# Patient Record
Sex: Female | Born: 1935
Health system: Southern US, Community
[De-identification: ages and names within clinical notes are randomized; demographics above are authoritative.]

## PROBLEM LIST (undated history)

## (undated) DIAGNOSIS — M1990 Unspecified osteoarthritis, unspecified site: Secondary | ICD-10-CM

## (undated) DIAGNOSIS — J302 Other seasonal allergic rhinitis: Secondary | ICD-10-CM

## (undated) DIAGNOSIS — M1711 Unilateral primary osteoarthritis, right knee: Secondary | ICD-10-CM

## (undated) DIAGNOSIS — B1910 Unspecified viral hepatitis B without hepatic coma: Secondary | ICD-10-CM

## (undated) DIAGNOSIS — R112 Nausea with vomiting, unspecified: Secondary | ICD-10-CM

## (undated) DIAGNOSIS — Z9889 Other specified postprocedural states: Secondary | ICD-10-CM

## (undated) DIAGNOSIS — I10 Essential (primary) hypertension: Secondary | ICD-10-CM

## (undated) HISTORY — PX: APPENDECTOMY: SHX54

## (undated) HISTORY — PX: CATARACT EXTRACTION, BILATERAL: SHX1313

---

## 1963-03-28 DIAGNOSIS — B1910 Unspecified viral hepatitis B without hepatic coma: Secondary | ICD-10-CM

## 1963-03-28 HISTORY — DX: Unspecified viral hepatitis B without hepatic coma: B19.10

## 1991-01-25 HISTORY — PX: ABDOMINAL HYSTERECTOMY: SHX81

## 2004-07-22 ENCOUNTER — Ambulatory Visit: Payer: Self-pay | Admitting: Internal Medicine

## 2004-08-07 ENCOUNTER — Ambulatory Visit (HOSPITAL_COMMUNITY): Admission: RE | Admit: 2004-08-07 | Discharge: 2004-08-07 | Payer: Self-pay | Admitting: Internal Medicine

## 2004-08-28 ENCOUNTER — Ambulatory Visit: Payer: Self-pay | Admitting: Internal Medicine

## 2004-09-24 ENCOUNTER — Ambulatory Visit: Payer: Self-pay | Admitting: Internal Medicine

## 2004-10-28 ENCOUNTER — Ambulatory Visit: Payer: Self-pay | Admitting: Internal Medicine

## 2004-11-13 ENCOUNTER — Ambulatory Visit: Payer: Self-pay | Admitting: Internal Medicine

## 2005-05-05 ENCOUNTER — Ambulatory Visit: Payer: Self-pay | Admitting: Internal Medicine

## 2005-08-18 ENCOUNTER — Ambulatory Visit (HOSPITAL_COMMUNITY): Admission: RE | Admit: 2005-08-18 | Discharge: 2005-08-18 | Payer: Self-pay | Admitting: Internal Medicine

## 2005-11-04 ENCOUNTER — Ambulatory Visit: Payer: Self-pay | Admitting: Internal Medicine

## 2005-11-06 ENCOUNTER — Ambulatory Visit: Payer: Self-pay | Admitting: Family Medicine

## 2005-11-10 ENCOUNTER — Ambulatory Visit (HOSPITAL_COMMUNITY): Admission: RE | Admit: 2005-11-10 | Discharge: 2005-11-10 | Payer: Self-pay | Admitting: Family Medicine

## 2006-03-12 ENCOUNTER — Ambulatory Visit: Payer: Self-pay | Admitting: Internal Medicine

## 2006-08-04 ENCOUNTER — Ambulatory Visit: Payer: Self-pay | Admitting: Internal Medicine

## 2006-08-24 ENCOUNTER — Ambulatory Visit (HOSPITAL_COMMUNITY): Admission: RE | Admit: 2006-08-24 | Discharge: 2006-08-24 | Payer: Self-pay | Admitting: Internal Medicine

## 2006-09-17 ENCOUNTER — Ambulatory Visit: Payer: Self-pay | Admitting: Internal Medicine

## 2007-04-29 DIAGNOSIS — I10 Essential (primary) hypertension: Secondary | ICD-10-CM | POA: Insufficient documentation

## 2007-04-29 DIAGNOSIS — Z9849 Cataract extraction status, unspecified eye: Secondary | ICD-10-CM | POA: Insufficient documentation

## 2007-04-29 DIAGNOSIS — E785 Hyperlipidemia, unspecified: Secondary | ICD-10-CM | POA: Insufficient documentation

## 2007-06-27 ENCOUNTER — Telehealth (INDEPENDENT_AMBULATORY_CARE_PROVIDER_SITE_OTHER): Payer: Self-pay | Admitting: Nurse Practitioner

## 2007-07-11 ENCOUNTER — Telehealth (INDEPENDENT_AMBULATORY_CARE_PROVIDER_SITE_OTHER): Payer: Self-pay | Admitting: *Deleted

## 2007-07-26 ENCOUNTER — Ambulatory Visit: Payer: Self-pay | Admitting: Family Medicine

## 2007-07-26 ENCOUNTER — Encounter (INDEPENDENT_AMBULATORY_CARE_PROVIDER_SITE_OTHER): Payer: Self-pay | Admitting: Nurse Practitioner

## 2007-07-26 DIAGNOSIS — N3000 Acute cystitis without hematuria: Secondary | ICD-10-CM | POA: Insufficient documentation

## 2007-07-26 DIAGNOSIS — E876 Hypokalemia: Secondary | ICD-10-CM | POA: Insufficient documentation

## 2007-07-26 LAB — CONVERTED CEMR LAB
Bilirubin Urine: NEGATIVE
Cholesterol, target level: 200 mg/dL
Glucose, Urine, Semiquant: NEGATIVE
HDL goal, serum: 40 mg/dL
LDL Goal: 130 mg/dL
WBC Urine, dipstick: NEGATIVE
pH: 6

## 2007-07-27 ENCOUNTER — Encounter (INDEPENDENT_AMBULATORY_CARE_PROVIDER_SITE_OTHER): Payer: Self-pay | Admitting: Nurse Practitioner

## 2007-07-27 DIAGNOSIS — R809 Proteinuria, unspecified: Secondary | ICD-10-CM | POA: Insufficient documentation

## 2007-07-27 LAB — CONVERTED CEMR LAB
ALT: 14 units/L (ref 0–35)
Albumin: 4.4 g/dL (ref 3.5–5.2)
Alkaline Phosphatase: 92 units/L (ref 39–117)
Basophils Absolute: 0 10*3/uL (ref 0.0–0.1)
Eosinophils Absolute: 0.1 10*3/uL (ref 0.0–0.7)
Eosinophils Relative: 1 % (ref 0–5)
Glucose, Bld: 96 mg/dL (ref 70–99)
LDL Cholesterol: 67 mg/dL (ref 0–99)
Lymphocytes Relative: 50 % — ABNORMAL HIGH (ref 12–46)
Lymphs Abs: 2.3 10*3/uL (ref 0.7–4.0)
MCV: 95.5 fL (ref 78.0–100.0)
Microalb, Ur: 7.76 mg/dL — ABNORMAL HIGH (ref 0.00–1.89)
Neutrophils Relative %: 42 % — ABNORMAL LOW (ref 43–77)
Platelets: 283 10*3/uL (ref 150–400)
Potassium: 3.2 meq/L — ABNORMAL LOW (ref 3.5–5.3)
RDW: 13.2 % (ref 11.5–15.5)
Sodium: 143 meq/L (ref 135–145)
TSH: 1.326 microintl units/mL (ref 0.350–5.50)
Total Bilirubin: 0.7 mg/dL (ref 0.3–1.2)
Total Protein: 7.6 g/dL (ref 6.0–8.3)
Triglycerides: 74 mg/dL (ref <150)
VLDL: 15 mg/dL (ref 0–40)
WBC: 4.5 10*3/uL (ref 4.0–10.5)

## 2007-08-30 ENCOUNTER — Encounter (INDEPENDENT_AMBULATORY_CARE_PROVIDER_SITE_OTHER): Payer: Self-pay | Admitting: Internal Medicine

## 2007-08-30 ENCOUNTER — Ambulatory Visit (HOSPITAL_COMMUNITY): Admission: RE | Admit: 2007-08-30 | Discharge: 2007-08-30 | Payer: Self-pay | Admitting: Family Medicine

## 2007-09-10 ENCOUNTER — Encounter (INDEPENDENT_AMBULATORY_CARE_PROVIDER_SITE_OTHER): Payer: Self-pay | Admitting: Internal Medicine

## 2008-02-03 ENCOUNTER — Ambulatory Visit: Payer: Self-pay | Admitting: Nurse Practitioner

## 2008-02-03 DIAGNOSIS — G44209 Tension-type headache, unspecified, not intractable: Secondary | ICD-10-CM

## 2008-02-03 DIAGNOSIS — R252 Cramp and spasm: Secondary | ICD-10-CM

## 2008-02-03 HISTORY — DX: Cramp and spasm: R25.2

## 2008-02-03 HISTORY — DX: Tension-type headache, unspecified, not intractable: G44.209

## 2008-02-07 ENCOUNTER — Ambulatory Visit (HOSPITAL_COMMUNITY): Admission: RE | Admit: 2008-02-07 | Discharge: 2008-02-07 | Payer: Self-pay | Admitting: Internal Medicine

## 2008-02-08 ENCOUNTER — Ambulatory Visit: Payer: Self-pay | Admitting: Nurse Practitioner

## 2008-02-08 LAB — CONVERTED CEMR LAB
Albumin: 4.3 g/dL (ref 3.5–5.2)
Alkaline Phosphatase: 68 units/L (ref 39–117)
BUN: 18 mg/dL (ref 6–23)
CO2: 28 meq/L (ref 19–32)
Calcium: 9.7 mg/dL (ref 8.4–10.5)
Chloride: 103 meq/L (ref 96–112)
Eosinophils Absolute: 0.1 10*3/uL (ref 0.0–0.7)
Glucose, Bld: 89 mg/dL (ref 70–99)
LDL Cholesterol: 61 mg/dL (ref 0–99)
Lymphocytes Relative: 54 % — ABNORMAL HIGH (ref 12–46)
Lymphs Abs: 2.5 10*3/uL (ref 0.7–4.0)
Monocytes Relative: 9 % (ref 3–12)
Neutro Abs: 1.7 10*3/uL (ref 1.7–7.7)
Neutrophils Relative %: 35 % — ABNORMAL LOW (ref 43–77)
Platelets: 258 10*3/uL (ref 150–400)
Potassium: 3.3 meq/L — ABNORMAL LOW (ref 3.5–5.3)
RBC: 4.13 M/uL (ref 3.87–5.11)
Sodium: 142 meq/L (ref 135–145)
Total Protein: 7 g/dL (ref 6.0–8.3)
Triglycerides: 92 mg/dL (ref <150)
WBC: 4.7 10*3/uL (ref 4.0–10.5)

## 2008-02-09 ENCOUNTER — Encounter (INDEPENDENT_AMBULATORY_CARE_PROVIDER_SITE_OTHER): Payer: Self-pay | Admitting: Nurse Practitioner

## 2008-09-17 ENCOUNTER — Emergency Department (HOSPITAL_COMMUNITY): Admission: EM | Admit: 2008-09-17 | Discharge: 2008-09-18 | Payer: Self-pay | Admitting: Emergency Medicine

## 2008-11-08 ENCOUNTER — Telehealth (INDEPENDENT_AMBULATORY_CARE_PROVIDER_SITE_OTHER): Payer: Self-pay | Admitting: Nurse Practitioner

## 2008-11-09 ENCOUNTER — Ambulatory Visit: Payer: Self-pay | Admitting: Nurse Practitioner

## 2008-11-09 LAB — CONVERTED CEMR LAB
Albumin: 4.5 g/dL (ref 3.5–5.2)
BUN: 14 mg/dL (ref 6–23)
Calcium: 9.8 mg/dL (ref 8.4–10.5)
Chloride: 103 meq/L (ref 96–112)
Cholesterol: 113 mg/dL (ref 0–200)
Creatinine, Ser: 0.94 mg/dL (ref 0.40–1.20)
Eosinophils Absolute: 0.1 10*3/uL (ref 0.0–0.7)
Eosinophils Relative: 2 % (ref 0–5)
Glucose, Bld: 106 mg/dL — ABNORMAL HIGH (ref 70–99)
HCT: 41.3 % (ref 36.0–46.0)
HDL: 43 mg/dL (ref >39)
Hemoglobin: 13.9 g/dL (ref 12.0–15.0)
Lymphocytes Relative: 51 % — ABNORMAL HIGH (ref 12–46)
Lymphs Abs: 2.7 10*3/uL (ref 0.7–4.0)
MCV: 97.2 fL (ref 78.0–100.0)
Monocytes Absolute: 0.5 10*3/uL (ref 0.1–1.0)
Potassium: 3.7 meq/L (ref 3.5–5.3)
RDW: 14.1 % (ref 11.5–15.5)
Triglycerides: 69 mg/dL (ref <150)

## 2008-11-12 ENCOUNTER — Encounter (INDEPENDENT_AMBULATORY_CARE_PROVIDER_SITE_OTHER): Payer: Self-pay | Admitting: Nurse Practitioner

## 2008-11-29 ENCOUNTER — Ambulatory Visit: Payer: Self-pay | Admitting: Nurse Practitioner

## 2008-11-29 DIAGNOSIS — F341 Dysthymic disorder: Secondary | ICD-10-CM

## 2008-11-29 DIAGNOSIS — Z780 Asymptomatic menopausal state: Secondary | ICD-10-CM | POA: Insufficient documentation

## 2008-11-29 HISTORY — DX: Dysthymic disorder: F34.1

## 2008-12-07 ENCOUNTER — Ambulatory Visit (HOSPITAL_COMMUNITY): Admission: RE | Admit: 2008-12-07 | Discharge: 2008-12-07 | Payer: Self-pay | Admitting: Internal Medicine

## 2008-12-11 ENCOUNTER — Encounter (INDEPENDENT_AMBULATORY_CARE_PROVIDER_SITE_OTHER): Payer: Self-pay | Admitting: Nurse Practitioner

## 2009-01-02 ENCOUNTER — Encounter (INDEPENDENT_AMBULATORY_CARE_PROVIDER_SITE_OTHER): Payer: Self-pay | Admitting: Nurse Practitioner

## 2009-01-11 ENCOUNTER — Ambulatory Visit: Payer: Self-pay | Admitting: Nurse Practitioner

## 2009-11-06 ENCOUNTER — Ambulatory Visit: Payer: Self-pay | Admitting: Nurse Practitioner

## 2009-11-06 DIAGNOSIS — IMO0002 Reserved for concepts with insufficient information to code with codable children: Secondary | ICD-10-CM

## 2009-11-06 DIAGNOSIS — M1710 Unilateral primary osteoarthritis, unspecified knee: Secondary | ICD-10-CM | POA: Insufficient documentation

## 2009-11-25 ENCOUNTER — Ambulatory Visit: Payer: Self-pay | Admitting: Nurse Practitioner

## 2009-11-25 ENCOUNTER — Ambulatory Visit (HOSPITAL_COMMUNITY): Admission: RE | Admit: 2009-11-25 | Discharge: 2009-11-25 | Payer: Self-pay | Admitting: Nurse Practitioner

## 2009-11-26 LAB — CONVERTED CEMR LAB
Basophils Relative: 0 % (ref 0–1)
CO2: 31 meq/L (ref 19–32)
Cholesterol: 109 mg/dL (ref 0–200)
Eosinophils Absolute: 0 10*3/uL (ref 0.0–0.7)
Eosinophils Relative: 1 % (ref 0–5)
Glucose, Bld: 88 mg/dL (ref 70–99)
HCT: 39.3 % (ref 36.0–46.0)
Hemoglobin: 13.2 g/dL (ref 12.0–15.0)
MCHC: 33.6 g/dL (ref 30.0–36.0)
MCV: 97 fL (ref 78.0–100.0)
Monocytes Absolute: 0.3 10*3/uL (ref 0.1–1.0)
Monocytes Relative: 7 % (ref 3–12)
Neutro Abs: 1.8 10*3/uL (ref 1.7–7.7)
RBC: 4.05 M/uL (ref 3.87–5.11)
Sodium: 143 meq/L (ref 135–145)
Total Bilirubin: 0.6 mg/dL (ref 0.3–1.2)
Total Protein: 7 g/dL (ref 6.0–8.3)
Triglycerides: 92 mg/dL (ref <150)
VLDL: 18 mg/dL (ref 0–40)

## 2009-12-09 ENCOUNTER — Ambulatory Visit: Payer: Self-pay | Admitting: Nurse Practitioner

## 2009-12-16 ENCOUNTER — Ambulatory Visit (HOSPITAL_COMMUNITY): Admission: RE | Admit: 2009-12-16 | Discharge: 2009-12-16 | Payer: Self-pay | Admitting: Internal Medicine

## 2010-06-02 ENCOUNTER — Telehealth (INDEPENDENT_AMBULATORY_CARE_PROVIDER_SITE_OTHER): Payer: Self-pay | Admitting: Nurse Practitioner

## 2010-07-02 ENCOUNTER — Telehealth (INDEPENDENT_AMBULATORY_CARE_PROVIDER_SITE_OTHER): Payer: Self-pay | Admitting: Nurse Practitioner

## 2010-07-27 HISTORY — PX: REPLACEMENT TOTAL KNEE: SUR1224

## 2010-08-16 ENCOUNTER — Encounter: Payer: Self-pay | Admitting: Occupational Therapy

## 2010-08-27 NOTE — Progress Notes (Signed)
Summary: Query:  Refill Amiloride?  Phone Note Outgoing Call   Summary of Call: Do you want to refill amiloride?  Last seen 5/11. Initial call taken by: Dutch Quint RN,  June 02, 2010 5:23 PM  Follow-up for Phone Call        yes, ok to refill Follow-up by: Lehman Prom FNP,  June 02, 2010 5:38 PM  Additional Follow-up for Phone Call Additional follow up Details #1::        Noted.  Refill completed. Dutch Quint RN  June 02, 2010 5:48 PM

## 2010-08-27 NOTE — Progress Notes (Signed)
Summary: Query:  Refill Klor-Con?  Phone Note Outgoing Call   Summary of Call: Refill Klor-Con?  Last seen 11/2009. Initial call taken by: Dutch Quint RN,  July 02, 2010 5:44 PM  Follow-up for Phone Call        refill done Follow-up by: Lehman Prom FNP,  July 02, 2010 5:55 PM    Prescriptions: POTASSIUM CHLORIDE CR 10 MEQ CR-TABS (POTASSIUM CHLORIDE) Two tablets by mouth daily  **note change in dose**  #60 x 5   Entered and Authorized by:   Lehman Prom FNP   Signed by:   Lehman Prom FNP on 07/02/2010   Method used:   Electronically to        [facility] [address] (retail)       [address]       [place], [place]  [id number]       Ph: [phone]       Fax: [phone]   RxID:   [id number]

## 2010-08-27 NOTE — Assessment & Plan Note (Signed)
Summary: F/u Left X-ray results   Vital Signs:  Patient profile:   76 year old female Menstrual status:  hysterectomy Weight:      151.4 pounds BMI:     29.19 BSA:     1.67 Temp:     97.6 degrees F oral Pulse rate:   66 / minute Pulse rhythm:   regular Resp:     16 per minute BP sitting:   107 / 66  (left arm) Cuff size:   regular  Vitals Entered By: Levon Hedger (Dec 09, 2009 8:54 AM) CC: left knee pain since the last week in March pt states , Hypertension Management, Lipid Management Is Patient Diabetic? No Pain Assessment Patient in pain? yes     Location: knee Intensity: 5 Onset of pain  With activity  Does patient need assistance? Functional Status Self care Ambulation Normal     Menstrual Status hysterectomy Last PAP Result hysterectomy in 1992   CC:  left knee pain since the last week in March pt states , Hypertension Management, and Lipid Management.  History of Present Illness:  Pt into the office for f/u on left knee pain. X-ray done on 11/25/2009  Some days knee pain is ok but on some days the knee is "on fire" Uses an assistive device - cane Tries to wear a knee suport but states that that makes it worse Irritation to the left knee when sleeping at night - medially No recent falls due to the pain  Hypertension History:      She denies headache, chest pain, and palpitations.        Positive major cardiovascular risk factors include female age 50 years old or older, hyperlipidemia, and hypertension.  Negative major cardiovascular risk factors include no history of diabetes and non-tobacco-user status.        Further assessment for target organ damage reveals no history of ASHD, cardiac end-organ damage (CHF/LVH), stroke/TIA, peripheral vascular disease, renal insufficiency, or hypertensive retinopathy.    Lipid Management History:      Positive NCEP/ATP III risk factors include female age 42 years old or older and hypertension.  Negative NCEP/ATP III  risk factors include non-diabetic, non-tobacco-user status, no ASHD (atherosclerotic heart disease), no prior stroke/TIA, no peripheral vascular disease, and no history of aortic aneurysm.        The patient states that she knows about the "Therapeutic Lifestyle Change" diet.  Her compliance with the TLC diet is good.  The patient expresses understanding of adjunctive measures for cholesterol lowering.  She expresses no side effects from her lipid-lowering medication.  The patient denies any symptoms to suggest myopathy or liver disease.      Allergies (verified): No Known Drug Allergies  Review of Systems MS:  Complains of joint pain; left knee.  Physical Exam  General:  alert.   Head:  normocephalic.   Ears:  ear piercing(s) noted.   Lungs:  normal breath sounds.   Heart:  normal rate and regular rhythm.   Msk:  up to the exam table with one assist Neurologic:  alert & oriented X3.   cane use - limping gait. limited flexion of knee with ambulation   Knee Exam  General:    average weight.    Knee Exam:    Left:    Inspection:  Normal    Palpation:  Abnormal       Location:  medial capsule    Stability:  stable    Tenderness:  medial capsule  Swelling:  medial capsule    Erythema:  no   Impression & Recommendations:  Problem # 1:  ARTHRITIS, LEFT KNEE (ICD-716.96) x-rays reviewed with pt will start prednisone taper will also give neurontin ? tendinitis as pt has sensitivity medially   Problem # 2:  HYPERTENSION (ICD-401.9) stable continue current meds Her updated medication list for this problem includes:    Norvasc 10 Mg Tabs (Amlodipine besylate) .Marland Kitchen... 1 by mouth every morning for blood pressure    Amiloride-hydrochlorothiazide 5-50 Mg Tabs (Amiloride-hydrochlorothiazide) .Marland Kitchen... 1 tablet by mouth daily for blood pressure  Problem # 3:  HYPOKALEMIA (ICD-276.8) noted during last lab visit pt aware - need new Rx sent to pharmacy  Problem # 4:  DYSLIPIDEMIA  (ICD-272.4) checked during last visit.  will check Her updated medication list for this problem includes:    Lovastatin 40 Mg Tabs (Lovastatin) .Marland Kitchen... 1 by mouth at bedtime for cholesterol  Complete Medication List: 1)  Norvasc 10 Mg Tabs (Amlodipine besylate) .Marland Kitchen.. 1 by mouth every morning for blood pressure 2)  Nevanac 0.1 % Susp (Nepafenac) 3)  Diazepam 5 Mg Tabs (Diazepam) .... 1/2 to 1 at bedtime for rest 4)  Lovastatin 40 Mg Tabs (Lovastatin) .Marland Kitchen.. 1 by mouth at bedtime for cholesterol 5)  Amiloride-hydrochlorothiazide 5-50 Mg Tabs (Amiloride-hydrochlorothiazide) .Marland Kitchen.. 1 tablet by mouth daily for blood pressure 6)  Calcium 600/vitamin D 600-400 Mg-unit Chew (Calcium carbonate-vitamin d) .Marland Kitchen.. 1 tablet by mouth two times a day 7)  Loratadine 10 Mg Tabs (Loratadine) .Marland Kitchen.. 1 tablet by mouth daily as needed for allergies 8)  Potassium Chloride Cr 10 Meq Cr-tabs (Potassium chloride) .... Two tablets by mouth daily  **note change in dose** 9)  Celexa 20 Mg Tabs (Citalopram hydrobromide) .... One tablet by mouth daily for nerves 10)  Ra Fish Oil 1000 Mg Caps (Omega-3 fatty acids) .... Once daily 11)  Ibuprofen 800 Mg Tabs (Ibuprofen) .... One tablet by mouth three times a day as needed for pain 12)  Prednisone 10 Mg Tabs (Prednisone) .... 30mg  x 2 days, 20mg  x 2 days then 10mg  x 2 days for inflammation 13)  Gabapentin 300 Mg Caps (Gabapentin) .... One tablet by mouth nightly for 3 nights then as needed  Hypertension Assessment/Plan:      The patient's hypertensive risk group is category B: At least one risk factor (excluding diabetes) with no target organ damage.  Her calculated 10 year risk of coronary heart disease is 6 %.  Today's blood pressure is 107/66.  Her blood pressure goal is < 140/90.  Lipid Assessment/Plan:      Based on NCEP/ATP III, the patient's risk factor category is "2 or more risk factors and a calculated 10 year CAD risk of < 20%".  The patient's lipid goals are as follows:  Total cholesterol goal is 200; LDL cholesterol goal is 130; HDL cholesterol goal is 40; Triglyceride goal is 150.    Patient Instructions: 1)  Knee - x-ray shows mild arthritis 2)  ? some tendinitis in your knee (this would not show on x-ray) 3)  Start prednisone 4)  30mg  (3 tabs) Monday 5)  30mg  (3 tabs) Tuesday 6)  20mg  (2 tabs) Wednesday 7)  20mg  (2 tabs) Thursday 8)  10mg  (1 tab) Friday 9)  10mg  (1 tab) Saturday 10)  Take gabapentin 300mg  by mouth nightly for at least 3 nights.  This may make you sleepy. Then take as needed 11)  Follow up as needed for knee pain Prescriptions: GABAPENTIN 300  MG CAPS (GABAPENTIN) One tablet by mouth nightly for 3 nights then as needed  #30 x 0   Entered and Authorized by:   Lehman Prom FNP   Signed by:   Lehman Prom FNP on 12/09/2009   Method used:   Print then Give to Patient   RxID:   [id number] PREDNISONE 10 MG TABS (PREDNISONE) 30mg  x 2 days, 20mg  x 2 days then 10mg  x 2 days for inflammation  #12 x 0   Entered and Authorized by:   Lehman Prom FNP   Signed by:   Lehman Prom FNP on 12/09/2009   Method used:   Print then Give to Patient   RxID:   [id number] POTASSIUM CHLORIDE CR 10 MEQ CR-TABS (POTASSIUM CHLORIDE) Two tablets by mouth daily  **note change in dose**  #60 x 5   Entered and Authorized by:   Lehman Prom FNP   Signed by:   Lehman Prom FNP on 12/09/2009   Method used:   Print then Give to Patient   RxID:   [id number]    X-ray  Procedure date:  11/25/2009  Findings:      left knee - No acute fracture or subluxation. Diffuse osteopenia.  Mild degenerative changes

## 2010-08-27 NOTE — Assessment & Plan Note (Signed)
Summary: Left knee pain   Vital Signs:  Patient profile:   76 year old female Height:      60.50 inches Weight:      156.3 pounds Temp:     97.7 degrees F Pulse rate:   62 / minute Pulse rhythm:   regular Resp:     14 per minute BP sitting:   126 / 76  (right arm) Cuff size:   regular  Vitals Entered By: Chauncy Passy, SMA CC: Pt. is here complaining of left knee pain. This has been going on for about 3 weeks. She states her knee got swollen has a burning sensation that radiates behind her left thigh. She is concnered as well about some numbness she feels on her hands. , Depression, Hypertension Management, Lipid Management Is Patient Diabetic? No Pain Assessment Patient in pain? no       Does patient need assistance? Functional Status Self care Ambulation Normal   CC:  Pt. is here complaining of left knee pain. This has been going on for about 3 weeks. She states her knee got swollen has a burning sensation that radiates behind her left thigh. She is concnered as well about some numbness she feels on her hands. , Depression, Hypertension Management, and Lipid Management.  History of Present Illness:  Pt into the office with complaints of left knee pain Previous knee pain about 3 years ago that self resolved Current pain started March 26th and has continued until current time +swelling Tylenol did not resolve the pain, bayer aspirin did give some improvement Denies any trauma She has applied heat to the affected area limping gait  Depression History:      The patient is having a depressed mood most of the day and has a diminished interest in her usual daily activities.  The patient denies insomnia, fatigue (loss of energy), and recurrent thoughts of death or suicide.        The patient denies that she feels like life is not worth living, denies that she wishes that she were dead, and denies that she has thought about ending her life.         Depression Treatment  History:  Prior Medication Used:   Start Date: Assessment of Effect:   Comments:  Celexa (citalopram)     11/29/2008   much improvement     --  Hypertension History:      She denies headache, chest pain, and palpitations.  pt is taking meds as ordered.        Positive major cardiovascular risk factors include female age 29 years old or older, hyperlipidemia, and hypertension.  Negative major cardiovascular risk factors include no history of diabetes and non-tobacco-user status.        Further assessment for target organ damage reveals no history of ASHD, cardiac end-organ damage (CHF/LVH), stroke/TIA, peripheral vascular disease, renal insufficiency, or hypertensive retinopathy.    Lipid Management History:      Positive NCEP/ATP III risk factors include female age 40 years old or older and hypertension.  Negative NCEP/ATP III risk factors include non-diabetic, non-tobacco-user status, no ASHD (atherosclerotic heart disease), no prior stroke/TIA, no peripheral vascular disease, and no history of aortic aneurysm.        The patient states that she knows about the "Therapeutic Lifestyle Change" diet.  Her compliance with the TLC diet is fair.  She expresses no side effects from her lipid-lowering medication.  Comments include: pt is taking meds as ordered.  The patient denies any symptoms to suggest myopathy or liver disease.       Habits & Providers  Alcohol-Tobacco-Diet     Alcohol drinks/day: 0     Tobacco Status: never  Exercise-Depression-Behavior     Does Patient Exercise: no     Depression Counseling: further diagnostic testing and/or other treatment is indicated     Drug Use: never     Seat Belt Use: 100     Sun Exposure: occasionally  Current Medications (verified): 1)  Norvasc 10 Mg  Tabs (Amlodipine Besylate) .Marland Kitchen.. 1 By Mouth Every Morning For Blood Pressure 2)  Nevanac 0.1 %  Susp (Nepafenac) 3)  Diazepam 5 Mg  Tabs (Diazepam) .... 1/2 To 1 At Bedtime For Rest 4)   Lovastatin 40 Mg  Tabs (Lovastatin) .Marland Kitchen.. 1 By Mouth At Bedtime For Cholesterol 5)  Amiloride-Hydrochlorothiazide 5-50 Mg  Tabs (Amiloride-Hydrochlorothiazide) .Marland Kitchen.. 1 Tablet By Mouth Daily For Blood Pressure 6)  Calcium 600/vitamin D 600-400 Mg-Unit  Chew (Calcium Carbonate-Vitamin D) .Marland Kitchen.. 1 Tablet By Mouth Two Times A Day 7)  Loratadine 10 Mg  Tabs (Loratadine) .Marland Kitchen.. 1 Tablet By Mouth Daily As Needed For Allergies 8)  Potassium Chloride Cr 10 Meq Cr-Tabs (Potassium Chloride) .... One Tablet By Mouth Daily 9)  Celexa 20 Mg Tabs (Citalopram Hydrobromide) .... One Tablet By Mouth Daily For Nerves 10)  Ra Fish Oil 1000 Mg Caps (Omega-3 Fatty Acids) .... Once Daily  Allergies (verified): No Known Drug Allergies  Review of Systems General:  Denies fever. CV:  Denies chest pain or discomfort. Resp:  Denies cough. GI:  Denies abdominal pain. MS:  Complains of joint pain; left knee stiffness and swelling noticed some pain in lower back but only after problems with knee started.  Physical Exam  General:  alert.   Head:  normocephalic.   Lungs:  normal breath sounds.   Heart:  normal rate and regular rhythm.   Msk:  up to the exam table Neurologic:  alert & oriented X3.     Knee Exam  General:    average weight.    Knee Exam:    Left:    Inspection:  Abnormal    Palpation:  Abnormal       Location:  medial capsule    Stability:  stable    Tenderness:  medial collateral    Swelling:  medial collateral    Erythema:  no    arthritic changes    Impression & Recommendations:  Problem # 1:  KNEE PAIN, LEFT (ICD-719.46)  advised pt to use ace wrap ibuprofen 800mg  by mouth three times a day x 3 days then as needed  will need to x-ray if pain continues or worsens Her updated medication list for this problem includes:    Ibuprofen 800 Mg Tabs (Ibuprofen) ..... One tablet by mouth three times a day as needed for pain  Problem # 2:  HYPERTENSION (ICD-401.9) BP is stable DASH  diet Her updated medication list for this problem includes:    Norvasc 10 Mg Tabs (Amlodipine besylate) .Marland Kitchen... 1 by mouth every morning for blood pressure    Amiloride-hydrochlorothiazide 5-50 Mg Tabs (Amiloride-hydrochlorothiazide) .Marland Kitchen... 1 tablet by mouth daily for blood pressure  Problem # 3:  UNSPECIFIED BREAST SCREENING (ICD-V76.10) will schedule after May 14th Orders: Mammogram (Screening) (Mammo)  Complete Medication List: 1)  Norvasc 10 Mg Tabs (Amlodipine besylate) .Marland Kitchen.. 1 by mouth every morning for blood pressure 2)  Nevanac 0.1 % Susp (Nepafenac) 3)  Diazepam  5 Mg Tabs (Diazepam) .... 1/2 to 1 at bedtime for rest 4)  Lovastatin 40 Mg Tabs (Lovastatin) .Marland Kitchen.. 1 by mouth at bedtime for cholesterol 5)  Amiloride-hydrochlorothiazide 5-50 Mg Tabs (Amiloride-hydrochlorothiazide) .Marland Kitchen.. 1 tablet by mouth daily for blood pressure 6)  Calcium 600/vitamin D 600-400 Mg-unit Chew (Calcium carbonate-vitamin d) .Marland Kitchen.. 1 tablet by mouth two times a day 7)  Loratadine 10 Mg Tabs (Loratadine) .Marland Kitchen.. 1 tablet by mouth daily as needed for allergies 8)  Potassium Chloride Cr 10 Meq Cr-tabs (Potassium chloride) .... One tablet by mouth daily 9)  Celexa 20 Mg Tabs (Citalopram hydrobromide) .... One tablet by mouth daily for nerves 10)  Ra Fish Oil 1000 Mg Caps (Omega-3 fatty acids) .... Once daily 11)  Ibuprofen 800 Mg Tabs (Ibuprofen) .... One tablet by mouth three times a day as needed for pain  Hypertension Assessment/Plan:      The patient's hypertensive risk group is category B: At least one risk factor (excluding diabetes) with no target organ damage.  Her calculated 10 year risk of coronary heart disease is 9 %.  Today's blood pressure is 126/76.  Her blood pressure goal is < 140/90.  Lipid Assessment/Plan:      Based on NCEP/ATP III, the patient's risk factor category is "2 or more risk factors and a calculated 10 year CAD risk of < 20%".  The patient's lipid goals are as follows: Total cholesterol goal  is 200; LDL cholesterol goal is 130; HDL cholesterol goal is 40; Triglyceride goal is 150.    Patient Instructions: 1)  Schedule fasting lab visit in 2 weeks - cbc, cmp, thyroid, lipids 2)  Do not eat after midnight before this visit 3)  Left knee - take iburprofen 800mg  by mouth with breakfast, lunch and dinner for Wednesday, Thursday and Friday. 4)  Then take as needed. 5)  Use Ace Wrap when up during the day 6)  If knee pain continues you will need to follow up with this provider Prescriptions: IBUPROFEN 800 MG TABS (IBUPROFEN) One tablet by mouth three times a day as needed for pain  #50 x 0   Entered and Authorized by:   Lehman Prom FNP   Signed by:   Lehman Prom FNP on 11/06/2009   Method used:   Print then Give to Patient   RxID:   [id number] DIAZEPAM 5 MG  TABS (DIAZEPAM) 1/2 to 1 at bedtime for rest  #30 x 0   Entered and Authorized by:   Lehman Prom FNP   Signed by:   Lehman Prom FNP on 11/06/2009   Method used:   Print then Give to Patient   RxID:   [id number]

## 2010-09-09 ENCOUNTER — Other Ambulatory Visit: Payer: Self-pay | Admitting: General Practice

## 2010-09-09 ENCOUNTER — Other Ambulatory Visit (HOSPITAL_COMMUNITY): Payer: Self-pay | Admitting: Cardiology

## 2010-09-09 ENCOUNTER — Ambulatory Visit
Admission: RE | Admit: 2010-09-09 | Discharge: 2010-09-09 | Disposition: A | Discharge status: Home / Self Care | Payer: Medicaid Other | Source: Ambulatory Visit | Attending: General Practice | Admitting: General Practice

## 2010-09-09 DIAGNOSIS — Z01811 Encounter for preprocedural respiratory examination: Secondary | ICD-10-CM

## 2010-09-25 ENCOUNTER — Telehealth (INDEPENDENT_AMBULATORY_CARE_PROVIDER_SITE_OTHER): Payer: Self-pay | Admitting: Nurse Practitioner

## 2010-10-02 NOTE — Progress Notes (Signed)
Summary: Query:  Refill amiloride-HCTZ?  Phone Note Outgoing Call   Refills Requested: Medication #1:  AMILORIDE-HYDROCHLOROTHIAZIDE 5-50 MG  TABS 1 tablet by mouth daily for blood pressure Summary of Call: Last seen 11/2009.  Refill amiloride-HCTZ per protocol? Initial call taken by: Dutch Quint RN,  September 25, 2010 10:10 AM  Follow-up for Phone Call        ok to refill. call pt and advise her to make a f/u appt Follow-up by: Lehman Prom FNP,  September 25, 2010 10:41 AM  Additional Follow-up for Phone Call Additional follow up Details #1::        Refill completed -- pt. notified.  Appt. scheduled 10/09/10.  Dutch Quint RN  September 25, 2010 12:52 PM

## 2010-10-03 ENCOUNTER — Ambulatory Visit (HOSPITAL_COMMUNITY)
Admission: RE | Admit: 2010-10-03 | Discharge: 2010-10-03 | Disposition: A | Discharge status: Home / Self Care | Payer: Medicare Other | Source: Ambulatory Visit | Attending: Cardiology | Admitting: Cardiology

## 2010-10-03 ENCOUNTER — Ambulatory Visit (HOSPITAL_COMMUNITY): Admission: RE | Admit: 2010-10-03 | Payer: Medicaid Other | Source: Ambulatory Visit

## 2010-10-03 DIAGNOSIS — R079 Chest pain, unspecified: Secondary | ICD-10-CM | POA: Insufficient documentation

## 2010-10-03 DIAGNOSIS — Z01810 Encounter for preprocedural cardiovascular examination: Secondary | ICD-10-CM | POA: Insufficient documentation

## 2010-10-03 DIAGNOSIS — R002 Palpitations: Secondary | ICD-10-CM | POA: Insufficient documentation

## 2010-10-03 DIAGNOSIS — R9439 Abnormal result of other cardiovascular function study: Secondary | ICD-10-CM | POA: Insufficient documentation

## 2010-10-03 DIAGNOSIS — I4949 Other premature depolarization: Secondary | ICD-10-CM | POA: Insufficient documentation

## 2010-10-03 DIAGNOSIS — I10 Essential (primary) hypertension: Secondary | ICD-10-CM | POA: Insufficient documentation

## 2010-10-03 DIAGNOSIS — I491 Atrial premature depolarization: Secondary | ICD-10-CM | POA: Insufficient documentation

## 2010-10-03 DIAGNOSIS — E7800 Pure hypercholesterolemia, unspecified: Secondary | ICD-10-CM | POA: Insufficient documentation

## 2010-10-03 LAB — LIPID PANEL: VLDL: 33 mg/dL (ref 0–40)

## 2010-10-03 LAB — HEPATIC FUNCTION PANEL
AST: 30 U/L (ref 0–37)
Albumin: 4 g/dL (ref 3.5–5.2)
Alkaline Phosphatase: 68 U/L (ref 39–117)
Total Bilirubin: 0.8 mg/dL (ref 0.3–1.2)

## 2010-10-03 LAB — BASIC METABOLIC PANEL
BUN: 8 mg/dL (ref 6–23)
CO2: 29 mEq/L (ref 19–32)
Calcium: 9.6 mg/dL (ref 8.4–10.5)
Creatinine, Ser: 0.87 mg/dL (ref 0.4–1.2)
GFR calc Af Amer: >60 mL/min (ref >60)
Glucose, Bld: 108 mg/dL — ABNORMAL HIGH (ref 70–99)

## 2010-10-03 MED ORDER — TECHNETIUM TC 99M TETROFOSMIN IV KIT
10.0000 | PACK | Freq: Once | INTRAVENOUS | Status: AC | PRN
  Administered 2010-10-03: 10 via INTRAVENOUS

## 2010-10-03 MED ORDER — TECHNETIUM TC 99M TETROFOSMIN IV KIT
30.0000 | PACK | Freq: Once | INTRAVENOUS | Status: AC | PRN
  Administered 2010-10-03: 30 via INTRAVENOUS

## 2010-11-11 LAB — URINE MICROSCOPIC-ADD ON

## 2010-11-11 LAB — URINE CULTURE: Colony Count: 40000

## 2010-11-11 LAB — URINALYSIS, ROUTINE W REFLEX MICROSCOPIC
Bilirubin Urine: NEGATIVE
Glucose, UA: NEGATIVE mg/dL
Ketones, ur: NEGATIVE mg/dL
Protein, ur: NEGATIVE mg/dL
Urobilinogen, UA: 1 mg/dL (ref 0.0–1.0)

## 2010-12-23 ENCOUNTER — Encounter (HOSPITAL_COMMUNITY)
Admission: RE | Admit: 2010-12-23 | Discharge: 2010-12-23 | Disposition: A | Discharge status: Home / Self Care | Payer: Medicare Other | Source: Ambulatory Visit | Attending: Orthopedic Surgery | Admitting: Orthopedic Surgery

## 2010-12-25 ENCOUNTER — Encounter (HOSPITAL_COMMUNITY)
Admission: RE | Admit: 2010-12-25 | Discharge: 2010-12-25 | Disposition: A | Discharge status: Home / Self Care | Payer: Medicare Other | Source: Ambulatory Visit | Attending: Orthopedic Surgery | Admitting: Orthopedic Surgery

## 2010-12-25 LAB — DIFFERENTIAL
Eosinophils Absolute: 0.1 10*3/uL (ref 0.0–0.7)
Eosinophils Relative: 1 % (ref 0–5)
Lymphs Abs: 2.6 10*3/uL (ref 0.7–4.0)
Monocytes Absolute: 0.4 10*3/uL (ref 0.1–1.0)
Monocytes Relative: 7 % (ref 3–12)

## 2010-12-25 LAB — URINALYSIS, ROUTINE W REFLEX MICROSCOPIC
Bilirubin Urine: NEGATIVE
Glucose, UA: NEGATIVE mg/dL
Ketones, ur: NEGATIVE mg/dL
Protein, ur: NEGATIVE mg/dL

## 2010-12-25 LAB — COMPREHENSIVE METABOLIC PANEL
Alkaline Phosphatase: 76 U/L (ref 39–117)
BUN: 11 mg/dL (ref 6–23)
CO2: 31 mEq/L (ref 19–32)
Chloride: 106 mEq/L (ref 96–112)
Creatinine, Ser: 0.86 mg/dL (ref 0.4–1.2)
GFR calc non Af Amer: >60 mL/min (ref >60)
Glucose, Bld: 101 mg/dL — ABNORMAL HIGH (ref 70–99)
Potassium: 3.6 mEq/L (ref 3.5–5.1)
Total Bilirubin: 0.7 mg/dL (ref 0.3–1.2)

## 2010-12-25 LAB — CBC
HCT: 38.4 % (ref 36.0–46.0)
MCH: 33.3 pg (ref 26.0–34.0)
MCHC: 35.2 g/dL (ref 30.0–36.0)
MCV: 94.6 fL (ref 78.0–100.0)
Platelets: 248 10*3/uL (ref 150–400)
RDW: 12.8 % (ref 11.5–15.5)
WBC: 5.6 10*3/uL (ref 4.0–10.5)

## 2010-12-25 LAB — ABO/RH: ABO/RH(D): AB POS

## 2010-12-25 LAB — SURGICAL PCR SCREEN: MRSA, PCR: NEGATIVE

## 2010-12-26 LAB — URINE CULTURE
Colony Count: 9000
Culture  Setup Time: 201205311949

## 2011-01-05 ENCOUNTER — Inpatient Hospital Stay (HOSPITAL_COMMUNITY)
Admission: RE | Admit: 2011-01-05 | Discharge: 2011-01-08 | DRG: 470 | Disposition: A | Discharge status: Skilled Nursing Facility | Payer: Medicare Other | Source: Ambulatory Visit | Attending: Orthopedic Surgery | Admitting: Orthopedic Surgery

## 2011-01-05 DIAGNOSIS — I10 Essential (primary) hypertension: Secondary | ICD-10-CM | POA: Diagnosis present

## 2011-01-05 DIAGNOSIS — Z79899 Other long term (current) drug therapy: Secondary | ICD-10-CM

## 2011-01-05 DIAGNOSIS — Z7901 Long term (current) use of anticoagulants: Secondary | ICD-10-CM

## 2011-01-05 DIAGNOSIS — Z01810 Encounter for preprocedural cardiovascular examination: Secondary | ICD-10-CM

## 2011-01-05 DIAGNOSIS — M1710 Unilateral primary osteoarthritis, unspecified knee: Principal | ICD-10-CM | POA: Diagnosis present

## 2011-01-05 DIAGNOSIS — I959 Hypotension, unspecified: Secondary | ICD-10-CM | POA: Diagnosis not present

## 2011-01-05 DIAGNOSIS — Z01812 Encounter for preprocedural laboratory examination: Secondary | ICD-10-CM

## 2011-01-05 DIAGNOSIS — Z91040 Latex allergy status: Secondary | ICD-10-CM

## 2011-01-05 DIAGNOSIS — D62 Acute posthemorrhagic anemia: Secondary | ICD-10-CM | POA: Diagnosis not present

## 2011-01-06 LAB — CBC
HCT: 29.2 % — ABNORMAL LOW (ref 36.0–46.0)
MCHC: 35.3 g/dL (ref 30.0–36.0)
MCV: 94.2 fL (ref 78.0–100.0)
Platelets: 216 10*3/uL (ref 150–400)
RDW: 13.2 % (ref 11.5–15.5)
WBC: 11.6 10*3/uL — ABNORMAL HIGH (ref 4.0–10.5)

## 2011-01-06 LAB — BASIC METABOLIC PANEL
BUN: 10 mg/dL (ref 6–23)
Creatinine, Ser: 0.77 mg/dL (ref 0.4–1.2)
GFR calc Af Amer: >60 mL/min (ref >60)
GFR calc non Af Amer: >60 mL/min (ref >60)

## 2011-01-06 LAB — GLUCOSE, CAPILLARY: Glucose-Capillary: 123 mg/dL — ABNORMAL HIGH (ref 70–99)

## 2011-01-06 NOTE — Op Note (Signed)
NAMEMarland Collins  AMAURIA, YOUNTS NO.:  [id number]  MEDICAL RECORD NO.:  [id number]  LOCATION:  5004                         FACILITY:  [facility]  PHYSICIAN:  Mila Homer. Sherlean Foot, M.D. DATE OF BIRTH:  08-Jan-1935  DATE OF PROCEDURE:  01/05/2011 DATE OF DISCHARGE:                              OPERATIVE REPORT   SURGEON:  Mila Homer. Sherlean Foot, MD  ASSISTANT:  Altamese Cabal, PA-C and Laural Benes. Su Hilt, PA-C  ANESTHESIA:  General.  PREOPERATIVE DIAGNOSIS:  Left knee osteoarthritis.  POSTOPERATIVE DIAGNOSIS:  Left knee osteoarthritis.  PROCEDURE:  Left total knee arthroplasty.  INDICATIONS FOR PROCEDURE:  The patient is a 76 year old black female with failure of conservative measures for osteoarthritis of the left knee.  Informed consent was obtained.  DESCRIPTION OF PROCEDURE:  The patient was laid supine, administered general anesthesia.  Foley catheter was placed.  Left leg was prepped and draped in the usual sterile fashion.  A midline incision made with a #10 blade after exsanguination of the extremity and elevation of tourniquet 350 mmHg.  New blade was used to make a median parapatellar arthrotomy and perform synovectomy.  I elevated the deep MCL off the medial crest of the tibia around to the semimembranosus tendon.  I everted the patella, measured 22-mm thick.  I reamed down 8.5 mm. Drilled through lug holes through a 32-mm template with the prosthetic trial in place, I recreated 24-mm thickness.  I then subluxed the patella laterally and went into flexion.  I used the extramedullary alignment system on the tibia to make perpendicular cut to the anatomic axis.  I used the intramedullary system on the femur to make a 6-degree valgus cut.  I then drew out the epicondylar axis measured 3 degrees.  I then sized to size D, pinned to the 3-degree external rotation holes and drilled and then placed a formal cutting block onto the anterior, posterior and chamfer cuts.  I then  placed a lamina spreader in the knee and removed the ACL, PCL and medial and lateral menisci and posterior condylar osteophyte.  I then placed a 10-12 mm spacer blocks in the knee and had better flexion/extension gap balance and chose a 12.  I then finished the femur with a D finishing block, finished the tibia with a 3 tibial tray drilling keel.  Then trialed with the 3 femur 3 tibia, 12 insert, 32 patella had good flexion/extension gap balance, good dropping angle and good patellar tracking.  I then removed the trial components, copiously irrigated.  I then cemented the components and removed all excess cement and allowed the cement to harden in extension.  I placed a Hemovac to come out superolaterally and deep arthrotomy pain catheter coming out supermedially and superficial arthrotomy.  I then let tourniquet down, obtained hemostasis.  I then copiously irrigated.  I then infiltrated the capsule with 0.25% Marcaine.  I then closed the arthrotomy with figure-of-eight #1 Vicryl sutures, deep soft tissues and buried 0 Vicryl sutures, subcuticular 2-0 Vicryl stitch and skin staples.  I then dressed with Xeroform dressing sponges, sterile Webril, and TED stocking.  COMPLICATIONS:  None.  DRAINS:  None.  ______________________________ Mila Homer Sherlean Foot, M.D.     SDL/MEDQ  D:  01/05/2011  T:  01/06/2011  Job:  [id number]  Electronically Signed by Georgena Spurling M.D. on 01/06/2011 11:33:11 AM

## 2011-01-07 LAB — BASIC METABOLIC PANEL
CO2: 31 mEq/L (ref 19–32)
Calcium: 8.8 mg/dL (ref 8.4–10.5)
Creatinine, Ser: 0.82 mg/dL (ref 0.4–1.2)
GFR calc Af Amer: >60 mL/min (ref >60)
GFR calc non Af Amer: >60 mL/min (ref >60)
Sodium: 145 mEq/L (ref 135–145)

## 2011-01-07 LAB — CBC
MCH: 33.6 pg (ref 26.0–34.0)
MCHC: 35 g/dL (ref 30.0–36.0)
MCV: 95.8 fL (ref 78.0–100.0)
Platelets: 212 10*3/uL (ref 150–400)
RDW: 13.7 % (ref 11.5–15.5)

## 2011-01-08 LAB — CBC
MCV: 95.8 fL (ref 78.0–100.0)
Platelets: 194 10*3/uL (ref 150–400)
RDW: 13.6 % (ref 11.5–15.5)
WBC: 9.9 10*3/uL (ref 4.0–10.5)

## 2011-01-08 LAB — BASIC METABOLIC PANEL
Chloride: 109 mEq/L (ref 96–112)
Creatinine, Ser: 0.7 mg/dL (ref 0.4–1.2)
GFR calc Af Amer: >60 mL/min (ref >60)
Sodium: 144 mEq/L (ref 135–145)

## 2011-01-09 LAB — CROSSMATCH
Antibody Screen: NEGATIVE
Unit division: 0

## 2011-02-02 NOTE — Discharge Summary (Signed)
NAMEMarland Kitchen  Tara, Collins NO.:  [id number]  MEDICAL RECORD NO.:  [id number]  LOCATION:  5004                         FACILITY:  [facility]  PHYSICIAN:  Mila Homer. Sherlean Foot, M.D. DATE OF BIRTH:  12/18/1934  DATE OF ADMISSION:  01/05/2011 DATE OF DISCHARGE:  01/08/2011                              DISCHARGE SUMMARY   ADMISSION DIAGNOSIS:  Osteoarthritis of left knee.  DISCHARGE DIAGNOSES: 1. Osteoarthritis of left knee. 2. Status post left total knee arthroplasty. 3. Hypotension. 4. Acute blood loss anemia status post surgery.  PROCEDURE:  Left total knee arthroplasty.  HISTORY:  The patient is a 76 year old African-American female with complaint of pain in her left knee, now interfering with activities of daily living.  Conservative treatment has failed.  Risks and benefits of surgery were discussed with the patient.  The patient would like to proceed with the left total knee arthroplasty.  ALLERGIES:  UPON ADMISSION TO HOSPITAL, THE PATIENT WAS ALLERGIC TO LATEX, VICODIN, AND CODEINE.  ADMISSION MEDICATIONS:  Upon admission to the hospital, the patient was taking, 1. Acetaminophen 650 mg over-the-counter 2 tablets by mouth every 8     hours as needed. 2. Amlodipine 10 mg 1 tablet by mouth daily. 3. Diazepam 5 mg 1 tablet by mouth twice daily. 4. Folic acid 800 mcg 1 tablet by mouth daily. 5. Geritol over-the-counter 1 tablet by mouth daily. 6. Ibuprofen 800 mg 1 tablet by mouth three times a day with meal. 7. Losartan 100 mg 1 tablet by mouth daily. 8. Lovastatin 40 mg 1 tablet by mouth daily at bedtime. 9. Multivitamins over-the-counter 1 tablet by mouth daily. 10.Zinc 50 mg 1 tablet by mouth daily.  HOSPITAL COURSE:  This is a 76 year old female admitted on January 05, 2011, after appropriate laboratory studies were obtained preoperatively as well as Ancef on-call to the operating room.  She was taken to the OR where she underwent a left total knee  arthroplasty.  She tolerated the procedure well and was taken to PACU in good condition where she was placed on CPM machine 0-9 degrees 6-8 hours a day.  The patient was placed on p.o. pain medication.  Foley was placed intraoperatively and was given Dilaudid IV p.r.n.  On postop day #1, vital signs were stable although the patient was experiencing a little bit of hypotension.  The patient denied shortness of breath, calf pain, or chest pain.  The patient was started on Lovenox 30 mg subcu q.12 h at 8:00 a.m.  Consults to PT/OT and Care Management were made.  Plan was to go to SNF and skilled nursing facility at [facility], which consult was also made.  The patient was weightbearing as tolerated.  Incentive spirometry was taught.  On postop day #2, the patient continued with physical therapy.  Dressing was changed. Marcaine pump and Hemovac were discontinued.  Foley was discontinued. IV was heplocked.  The patient was continued on p.o. pain medication. On postop day #3, the patient continued to progress with physical therapy and was discharged to [facility] after Lovenox teaching.  LABORATORY STUDIES:  CBC and BMET remained stable except for low potassium 2.9.  The patient was subsequently given  40 mEq of KCl twice a day to rate up to 3.2 by day of discharge.  DISCHARGE INSTRUCTIONS:  There is no restriction diet.  She is to follow the total joint replacement and discharge instructions for wound care. Increase activity slowly.  May use a cane or walker.  Weightbearing as tolerated.  No lifting or driving for 6 weeks and will go to home health once she is dismissed from [facility]  PRESCRIPTIONS:  Upon discharge from the hospital, the patient was given prescription for, 1. OxyContin 10 mg one twice daily. 2. Oxycodone 5 mg 1-2 tablets every 4-6 hours as needed for pain. 3. Robaxin 500 mg 1-2 tablets every 6-8 hours as needed for spasm. 4. Lovenox 40 mg one daily x11 days for  DVT prevention. 5. KCl 20 mEq twice daily for hypokalemia.  The patient will follow with Dr. Sherlean Foot on January 20, 2011.  She is to call for appointment, [phone], will be in the [employer] office at 8:30 in the morning.  Discharged in improved condition.    ______________________________ Altamese Cabal, PA-C   ______________________________ Mila Homer. Sherlean Foot, M.D.    MJ/MEDQ  D:  01/08/2011  T:  01/08/2011  Job:  [id number]  Electronically Signed by Altamese Cabal PA-C on 01/29/2011 12:01:47 PM Electronically Signed by Georgena Spurling M.D. on 02/02/2011 12:05:42 PM

## 2011-03-05 ENCOUNTER — Ambulatory Visit: Payer: Medicare Other | Attending: Orthopedic Surgery | Admitting: Rehabilitation

## 2011-03-05 DIAGNOSIS — IMO0001 Reserved for inherently not codable concepts without codable children: Secondary | ICD-10-CM | POA: Insufficient documentation

## 2011-03-05 DIAGNOSIS — M25669 Stiffness of unspecified knee, not elsewhere classified: Secondary | ICD-10-CM | POA: Insufficient documentation

## 2011-03-05 DIAGNOSIS — M25569 Pain in unspecified knee: Secondary | ICD-10-CM | POA: Insufficient documentation

## 2011-03-05 DIAGNOSIS — Z96659 Presence of unspecified artificial knee joint: Secondary | ICD-10-CM | POA: Insufficient documentation

## 2011-03-10 ENCOUNTER — Ambulatory Visit: Payer: Medicare Other | Admitting: Physical Therapy

## 2011-03-11 ENCOUNTER — Ambulatory Visit: Payer: Medicare Other | Admitting: Physical Therapy

## 2011-03-12 ENCOUNTER — Ambulatory Visit: Payer: Medicare Other | Admitting: Physical Therapy

## 2011-03-16 ENCOUNTER — Ambulatory Visit: Payer: Medicare Other | Admitting: Physical Therapy

## 2011-03-18 ENCOUNTER — Ambulatory Visit: Payer: Medicare Other | Admitting: Rehabilitation

## 2011-03-19 ENCOUNTER — Ambulatory Visit: Payer: Medicare Other | Admitting: Rehabilitation

## 2011-03-23 ENCOUNTER — Ambulatory Visit: Payer: Medicare Other | Admitting: Physical Therapy

## 2011-03-25 ENCOUNTER — Ambulatory Visit: Payer: Medicare Other | Admitting: Rehabilitation

## 2011-03-26 ENCOUNTER — Ambulatory Visit: Payer: Medicare Other | Admitting: Rehabilitation

## 2011-03-31 ENCOUNTER — Ambulatory Visit: Payer: Medicare Other | Attending: Orthopedic Surgery | Admitting: Physical Therapy

## 2011-03-31 DIAGNOSIS — R262 Difficulty in walking, not elsewhere classified: Secondary | ICD-10-CM | POA: Insufficient documentation

## 2011-03-31 DIAGNOSIS — M25569 Pain in unspecified knee: Secondary | ICD-10-CM | POA: Insufficient documentation

## 2011-03-31 DIAGNOSIS — M25669 Stiffness of unspecified knee, not elsewhere classified: Secondary | ICD-10-CM | POA: Insufficient documentation

## 2011-03-31 DIAGNOSIS — IMO0001 Reserved for inherently not codable concepts without codable children: Secondary | ICD-10-CM | POA: Insufficient documentation

## 2011-04-01 ENCOUNTER — Ambulatory Visit: Payer: Medicare Other | Admitting: Physical Therapy

## 2011-04-02 ENCOUNTER — Ambulatory Visit: Payer: Medicare Other | Admitting: Physical Therapy

## 2011-04-14 ENCOUNTER — Ambulatory Visit: Payer: Medicare Other | Admitting: Rehabilitation

## 2011-04-15 ENCOUNTER — Ambulatory Visit: Payer: Medicare Other | Admitting: Rehabilitation

## 2011-04-22 ENCOUNTER — Ambulatory Visit: Payer: Medicare Other | Admitting: Rehabilitation

## 2011-04-28 ENCOUNTER — Encounter: Payer: Medicare Other | Admitting: Rehabilitation

## 2011-05-05 ENCOUNTER — Encounter: Payer: Medicare Other | Admitting: Rehabilitation

## 2011-05-05 ENCOUNTER — Other Ambulatory Visit: Payer: Self-pay | Admitting: General Practice

## 2011-05-05 DIAGNOSIS — M545 Low back pain, unspecified: Secondary | ICD-10-CM

## 2011-05-09 ENCOUNTER — Ambulatory Visit
Admission: RE | Admit: 2011-05-09 | Discharge: 2011-05-09 | Disposition: A | Discharge status: Home / Self Care | Payer: Medicare Other | Source: Ambulatory Visit | Attending: General Practice | Admitting: General Practice

## 2011-05-09 DIAGNOSIS — M545 Low back pain, unspecified: Secondary | ICD-10-CM

## 2011-05-12 ENCOUNTER — Ambulatory Visit: Payer: Medicare Other | Attending: Orthopedic Surgery | Admitting: Rehabilitation

## 2011-05-12 DIAGNOSIS — Z96659 Presence of unspecified artificial knee joint: Secondary | ICD-10-CM | POA: Insufficient documentation

## 2011-05-12 DIAGNOSIS — M25569 Pain in unspecified knee: Secondary | ICD-10-CM | POA: Insufficient documentation

## 2011-05-12 DIAGNOSIS — IMO0001 Reserved for inherently not codable concepts without codable children: Secondary | ICD-10-CM | POA: Insufficient documentation

## 2011-05-12 DIAGNOSIS — M25669 Stiffness of unspecified knee, not elsewhere classified: Secondary | ICD-10-CM | POA: Insufficient documentation

## 2011-05-13 ENCOUNTER — Encounter: Payer: Medicare Other | Admitting: Rehabilitation

## 2011-05-18 ENCOUNTER — Ambulatory Visit: Payer: Medicare Other

## 2011-05-18 ENCOUNTER — Ambulatory Visit: Payer: Medicare Other | Admitting: Physical Therapy

## 2011-05-20 ENCOUNTER — Encounter: Payer: Medicare Other | Admitting: Physical Therapy

## 2011-05-20 ENCOUNTER — Ambulatory Visit: Payer: Medicare Other | Admitting: Rehabilitation

## 2011-05-27 ENCOUNTER — Encounter: Payer: Medicare Other | Admitting: Rehabilitation

## 2011-05-27 ENCOUNTER — Ambulatory Visit: Payer: Medicare Other | Admitting: Rehabilitation

## 2011-05-28 ENCOUNTER — Encounter: Payer: Medicare Other | Admitting: Physical Therapy

## 2011-06-03 ENCOUNTER — Ambulatory Visit: Payer: Medicare Other | Attending: Orthopedic Surgery | Admitting: Rehabilitation

## 2011-06-03 DIAGNOSIS — Z96659 Presence of unspecified artificial knee joint: Secondary | ICD-10-CM | POA: Insufficient documentation

## 2011-06-03 DIAGNOSIS — M25569 Pain in unspecified knee: Secondary | ICD-10-CM | POA: Insufficient documentation

## 2011-06-03 DIAGNOSIS — IMO0001 Reserved for inherently not codable concepts without codable children: Secondary | ICD-10-CM | POA: Insufficient documentation

## 2011-06-03 DIAGNOSIS — M25669 Stiffness of unspecified knee, not elsewhere classified: Secondary | ICD-10-CM | POA: Insufficient documentation

## 2011-06-09 ENCOUNTER — Encounter: Payer: Medicare Other | Admitting: Rehabilitation

## 2011-06-10 ENCOUNTER — Ambulatory Visit: Payer: Medicare Other | Admitting: Rehabilitation

## 2011-06-11 ENCOUNTER — Encounter: Payer: Medicare Other | Admitting: Rehabilitation

## 2011-06-14 ENCOUNTER — Other Ambulatory Visit: Payer: Self-pay | Admitting: Cardiology

## 2011-06-15 ENCOUNTER — Encounter: Payer: Medicare Other | Admitting: Physical Therapy

## 2011-06-17 ENCOUNTER — Encounter: Payer: Medicare Other | Admitting: Rehabilitation

## 2011-06-24 ENCOUNTER — Ambulatory Visit: Payer: Medicare Other | Admitting: Rehabilitation

## 2011-07-01 ENCOUNTER — Ambulatory Visit: Payer: Medicare Other | Admitting: Rehabilitation

## 2011-07-01 ENCOUNTER — Ambulatory Visit: Payer: Medicare Other | Attending: Orthopedic Surgery | Admitting: Rehabilitation

## 2011-07-01 DIAGNOSIS — IMO0001 Reserved for inherently not codable concepts without codable children: Secondary | ICD-10-CM | POA: Insufficient documentation

## 2011-07-01 DIAGNOSIS — M25669 Stiffness of unspecified knee, not elsewhere classified: Secondary | ICD-10-CM | POA: Insufficient documentation

## 2011-07-01 DIAGNOSIS — Z96659 Presence of unspecified artificial knee joint: Secondary | ICD-10-CM | POA: Insufficient documentation

## 2011-07-01 DIAGNOSIS — M25569 Pain in unspecified knee: Secondary | ICD-10-CM | POA: Insufficient documentation

## 2013-12-15 ENCOUNTER — Ambulatory Visit
Admission: RE | Admit: 2013-12-15 | Discharge: 2013-12-15 | Disposition: A | Discharge status: Home / Self Care | Payer: Medicare Other | Source: Ambulatory Visit | Attending: General Practice | Admitting: General Practice

## 2013-12-15 ENCOUNTER — Other Ambulatory Visit: Payer: Self-pay | Admitting: General Practice

## 2013-12-15 DIAGNOSIS — R053 Chronic cough: Secondary | ICD-10-CM

## 2013-12-15 DIAGNOSIS — R05 Cough: Secondary | ICD-10-CM

## 2015-09-12 DIAGNOSIS — I10 Essential (primary) hypertension: Secondary | ICD-10-CM | POA: Diagnosis not present

## 2015-09-12 DIAGNOSIS — E782 Mixed hyperlipidemia: Secondary | ICD-10-CM | POA: Diagnosis not present

## 2015-09-12 DIAGNOSIS — G4700 Insomnia, unspecified: Secondary | ICD-10-CM | POA: Diagnosis not present

## 2015-12-11 DIAGNOSIS — E782 Mixed hyperlipidemia: Secondary | ICD-10-CM | POA: Diagnosis not present

## 2015-12-11 DIAGNOSIS — I10 Essential (primary) hypertension: Secondary | ICD-10-CM | POA: Diagnosis not present

## 2016-02-21 DIAGNOSIS — H6692 Otitis media, unspecified, left ear: Secondary | ICD-10-CM | POA: Diagnosis not present

## 2016-04-06 DIAGNOSIS — I10 Essential (primary) hypertension: Secondary | ICD-10-CM | POA: Diagnosis not present

## 2016-04-06 DIAGNOSIS — E782 Mixed hyperlipidemia: Secondary | ICD-10-CM | POA: Diagnosis not present

## 2016-07-23 DIAGNOSIS — E782 Mixed hyperlipidemia: Secondary | ICD-10-CM | POA: Diagnosis not present

## 2016-07-23 DIAGNOSIS — R197 Diarrhea, unspecified: Secondary | ICD-10-CM | POA: Diagnosis not present

## 2016-07-23 DIAGNOSIS — R7309 Other abnormal glucose: Secondary | ICD-10-CM | POA: Diagnosis not present

## 2016-07-23 DIAGNOSIS — I10 Essential (primary) hypertension: Secondary | ICD-10-CM | POA: Diagnosis not present

## 2016-12-02 DIAGNOSIS — I10 Essential (primary) hypertension: Secondary | ICD-10-CM | POA: Diagnosis not present

## 2016-12-02 DIAGNOSIS — E782 Mixed hyperlipidemia: Secondary | ICD-10-CM | POA: Diagnosis not present

## 2016-12-02 DIAGNOSIS — Z0000 Encounter for general adult medical examination without abnormal findings: Secondary | ICD-10-CM | POA: Diagnosis not present

## 2016-12-02 DIAGNOSIS — G4700 Insomnia, unspecified: Secondary | ICD-10-CM | POA: Diagnosis not present

## 2016-12-02 DIAGNOSIS — R7309 Other abnormal glucose: Secondary | ICD-10-CM | POA: Diagnosis not present

## 2016-12-02 DIAGNOSIS — M545 Low back pain: Secondary | ICD-10-CM | POA: Diagnosis not present

## 2017-06-09 DIAGNOSIS — I10 Essential (primary) hypertension: Secondary | ICD-10-CM | POA: Diagnosis not present

## 2017-06-09 DIAGNOSIS — E782 Mixed hyperlipidemia: Secondary | ICD-10-CM | POA: Diagnosis not present

## 2017-09-19 ENCOUNTER — Emergency Department (HOSPITAL_COMMUNITY): Payer: Medicare HMO

## 2017-09-19 ENCOUNTER — Emergency Department (HOSPITAL_COMMUNITY)
Admission: EM | Admit: 2017-09-19 | Discharge: 2017-09-19 | Disposition: A | Discharge status: Home / Self Care | Payer: Medicare HMO | Attending: Emergency Medicine | Admitting: Emergency Medicine

## 2017-09-19 DIAGNOSIS — R1111 Vomiting without nausea: Secondary | ICD-10-CM | POA: Diagnosis not present

## 2017-09-19 DIAGNOSIS — K8020 Calculus of gallbladder without cholecystitis without obstruction: Secondary | ICD-10-CM | POA: Diagnosis not present

## 2017-09-19 DIAGNOSIS — R1084 Generalized abdominal pain: Secondary | ICD-10-CM | POA: Diagnosis not present

## 2017-09-19 DIAGNOSIS — K439 Ventral hernia without obstruction or gangrene: Secondary | ICD-10-CM | POA: Diagnosis not present

## 2017-09-19 DIAGNOSIS — R109 Unspecified abdominal pain: Secondary | ICD-10-CM

## 2017-09-19 LAB — COMPREHENSIVE METABOLIC PANEL
ALT: 17 U/L (ref 14–54)
AST: 38 U/L (ref 15–41)
Albumin: 3.7 g/dL (ref 3.5–5.0)
Alkaline Phosphatase: 83 U/L (ref 38–126)
Anion gap: 10 (ref 5–15)
BILIRUBIN TOTAL: 0.6 mg/dL (ref 0.3–1.2)
BUN: 7 mg/dL (ref 6–20)
CO2: 21 mmol/L — ABNORMAL LOW (ref 22–32)
Calcium: 8.9 mg/dL (ref 8.9–10.3)
Chloride: 109 mmol/L (ref 101–111)
Creatinine, Ser: 0.81 mg/dL (ref 0.44–1.00)
GFR calc non Af Amer: >60 mL/min (ref >60)
Glucose, Bld: 101 mg/dL — ABNORMAL HIGH (ref 65–99)
POTASSIUM: 3.5 mmol/L (ref 3.5–5.1)
Sodium: 140 mmol/L (ref 135–145)
TOTAL PROTEIN: 7.1 g/dL (ref 6.5–8.1)

## 2017-09-19 LAB — CBC WITH DIFFERENTIAL/PLATELET
BASOS ABS: 0 10*3/uL (ref 0.0–0.1)
Basophils Relative: 0 %
Eosinophils Absolute: 0.1 10*3/uL (ref 0.0–0.7)
Eosinophils Relative: 2 %
HEMATOCRIT: 40.3 % (ref 36.0–46.0)
Hemoglobin: 13.8 g/dL (ref 12.0–15.0)
LYMPHS PCT: 35 %
Lymphs Abs: 2 10*3/uL (ref 0.7–4.0)
MCH: 33.3 pg (ref 26.0–34.0)
MCHC: 34.2 g/dL (ref 30.0–36.0)
MCV: 97.3 fL (ref 78.0–100.0)
MONO ABS: 0.3 10*3/uL (ref 0.1–1.0)
MONOS PCT: 6 %
NEUTROS ABS: 3.2 10*3/uL (ref 1.7–7.7)
Neutrophils Relative %: 57 %
Platelets: 298 10*3/uL (ref 150–400)
RBC: 4.14 MIL/uL (ref 3.87–5.11)
RDW: 12.3 % (ref 11.5–15.5)
WBC: 5.6 10*3/uL (ref 4.0–10.5)

## 2017-09-19 LAB — URINALYSIS, ROUTINE W REFLEX MICROSCOPIC
BILIRUBIN URINE: NEGATIVE
Glucose, UA: NEGATIVE mg/dL
HGB URINE DIPSTICK: NEGATIVE
Ketones, ur: NEGATIVE mg/dL
Leukocytes, UA: NEGATIVE
Nitrite: NEGATIVE
PH: 6 (ref 5.0–8.0)
Protein, ur: NEGATIVE mg/dL
SPECIFIC GRAVITY, URINE: 1.005 (ref 1.005–1.030)

## 2017-09-19 LAB — I-STAT TROPONIN, ED: Troponin i, poc: 0 ng/mL (ref 0.00–0.08)

## 2017-09-19 LAB — LIPASE, BLOOD: LIPASE: 37 U/L (ref 11–51)

## 2017-09-19 MED ORDER — ONDANSETRON HCL 4 MG/2ML IJ SOLN
4.0000 mg | Freq: Once | INTRAMUSCULAR | Status: AC
  Administered 2017-09-19: 4 mg via INTRAVENOUS
  Filled 2017-09-19: qty 2

## 2017-09-19 MED ORDER — SODIUM CHLORIDE 0.9 % IV BOLUS (SEPSIS)
500.0000 mL | Freq: Once | INTRAVENOUS | Status: AC
  Administered 2017-09-19: 500 mL via INTRAVENOUS

## 2017-09-19 MED ORDER — DICYCLOMINE HCL 10 MG PO CAPS
10.0000 mg | ORAL_CAPSULE | Freq: Once | ORAL | Status: AC
  Administered 2017-09-19: 10 mg via ORAL
  Filled 2017-09-19: qty 1

## 2017-09-19 MED ORDER — IOPAMIDOL (ISOVUE-300) INJECTION 61%
INTRAVENOUS | Status: AC
  Filled 2017-09-19: qty 100

## 2017-09-19 NOTE — ED Provider Notes (Signed)
Discussed ct with pt. Note made of gallstones, moderate stool in colon, and ventral hernia. No acute infectious process noted. Patients abd is soft and non tender on recheck. No incarcerated hernia. Her pain when occurs is more mid and left abdomen.   Pain currently improved/resolve, pt is afebrile, benign abd exam, no vomiting - pt currently appears stable for d/c.   Will refer to close outpt pcp f/u. For gallstones, will refer to gen surg. Pt does not intermittent mild constipation - will rec colace/miralax.   Return precautions provided.      Cathren LaineSteinl, Joe Gee, MD 09/19/17 [id number]

## 2017-09-19 NOTE — ED Notes (Signed)
Nurse starting IV and will draw lab.

## 2017-09-19 NOTE — ED Provider Notes (Signed)
TIME SEEN: 5:27 AM  CHIEF COMPLAINT: Abdominal pain   HPI: Patient is an 83 year old female with history of hypertension and hyperlipidemia who presents the emergency department with complaints of abdominal pain.  Symptoms have been ongoing for 2-3 days.  States that she feels "woozy" and feels discomfort throughout her abdomen especially after eating.  Pain worse around the bellybutton and in the left lower quadrant.  Also has had nausea but no vomiting.  Reports she has had "mucousy" stools but denies diarrhea, bloody stools or melena.  Has had previous hysterectomy but no other abdominal surgeries.  No sick contacts or recent travel.  No antibiotic use or hospitalization.  States with these episodes she feels like she is going to pass out.  Denies any chest pain, chest discomfort or shortness of breath.  ROS: See HPI Constitutional: no fever  Eyes: no drainage  ENT: no runny nose   Cardiovascular:  no chest pain  Resp: no SOB  GI: no vomiting GU: no dysuria Integumentary: no rash  Allergy: no hives  Musculoskeletal: no leg swelling  Neurological: no slurred speech ROS otherwise negative  PAST MEDICAL HISTORY/PAST SURGICAL HISTORY:  Hypertension, hyperlipidemia  MEDICATIONS:  Prior to Admission medications   Not on File    ALLERGIES:  Not on File  SOCIAL HISTORY:  Social History   Tobacco Use  . Smoking status: Not on file  Substance Use Topics  . Alcohol use: Not on file    FAMILY HISTORY: No family history on file.  EXAM: BP (!) 170/66 (BP Location: Right Arm)   Pulse 71   Temp 98.5 F (36.9 C) (Oral)   Resp 18   SpO2 100%  CONSTITUTIONAL: Alert and oriented and responds appropriately to questions. Well-appearing; well-nourished HEAD: Normocephalic EYES: Conjunctivae clear, pupils appear equal, EOMI ENT: normal nose; moist mucous membranes NECK: Supple, no meningismus, no nuchal rigidity, no LAD  CARD: RRR; S1 and S2 appreciated; no murmurs, no clicks, no  rubs, no gallops RESP: Normal chest excursion without splinting or tachypnea; breath sounds clear and equal bilaterally; no wheezes, no rhonchi, no rales, no hypoxia or respiratory distress, speaking full sentences ABD/GI: Normal bowel sounds; non-distended; soft, tender in the left lower quadrant and around the umbilicus, no rebound, no guarding, no peritoneal signs, no hepatosplenomegaly BACK:  The back appears normal and is non-tender to palpation, there is no CVA tenderness EXT: Normal ROM in all joints; non-tender to palpation; no edema; normal capillary refill; no cyanosis, no calf tenderness or swelling    SKIN: Normal color for age and race; warm; no rash NEURO: Moves all extremities equally PSYCH: The patient's mood and manner are appropriate. Grooming and personal hygiene are appropriate.  MEDICAL DECISION MAKING: Patient here with complaints of abdominal pain, nausea and mucus-like stools.  Very tender in the left lower quadrant.  Differential includes diverticulitis, colitis, less likely bowel obstruction or appendicitis, viral illness.  Will obtain labs, urine and a CT of her abdomen pelvis.  Will give IV fluids, Bentyl and Zofran for symptomatically relief.  ED PROGRESS: Patient has no leukocytosis.  Urine shows no sign of infection.  Troponin is negative.  CMP and lipase pending.  CTAP pending.  Signed out to Dr. Denton Lank to follow up on patient's labs and imaging for disposition.  I reviewed all nursing notes, vitals, pertinent previous records, EKGs, lab and urine results, imaging (as available).      EKG Interpretation  Date/Time:  Sunday September 19 2017 05:41:11 EST Ventricular Rate:  73 PR Interval:    QRS Duration: 74 QT Interval:  426 QTC Calculation: 470 R Axis:   14 Text Interpretation:  Sinus rhythm Borderline T wave abnormalities Baseline wander in lead(s) I III aVL No significant change since last tracing Confirmed by Mekaylah Klich, Baxter HireKristen [phone]([id number]) on 09/19/2017 6:00:47  AM          Tresea Heine, Layla MawKristen N, DO 09/19/17 [id number]

## 2017-09-19 NOTE — ED Triage Notes (Signed)
Per EMS pt from home with complaint of generalized abdominal pain since Thursday.  Pt has also had increased episodes of hunger and then after she eats she feels "crampy".  Has had episodes of what she describes as "fluffy" diarrhea and sometimes "feels faint" afterwards.

## 2017-09-19 NOTE — Discharge Instructions (Signed)
It was our pleasure to provide your ER care today - we hope that you feel better.  Your CT scan shows gallstones, a ventral hernia, and a large amount of stool in colon (?mild constipation).  For gallstones/hernia, follow up with general surgeon in their office in the next 1-2 weeks - call office Monday to arrange appointment.   If constipated, drink plenty of fluids, get adequate fiber in diet, and take colace (stool softener) and miralax (laxative) as need.   Also follow up with your primary care doctor in the coming week.  Return to ER if worse, new symptoms, fevers, new or severe pain, persistent vomiting, other concern.

## 2017-10-06 ENCOUNTER — Ambulatory Visit: Payer: Self-pay | Admitting: General Surgery

## 2017-10-06 DIAGNOSIS — K8020 Calculus of gallbladder without cholecystitis without obstruction: Secondary | ICD-10-CM | POA: Diagnosis not present

## 2017-10-06 NOTE — H&P (Signed)
History of Present Illness Axel Filler(Coy Vandoren MD; 10/06/2017 2:04 PM) The patient is a 83 year old female who presents for evaluation of gall stones. Referred by: Dr. Denton LankSteinl Chief Complaint: Abdominal pain  Patient is a 83 year old female, with a history of hypertension, hyperlipidemia, with a two-year history of epigastric abdominal pain, bloating. Patient states that this is been ongoing with associated nausea, vomiting and mucousy stools. Patient states that the discomfort in pain are associated with eating. She states these are mainly spicy foods which she has cut out currently.  Her most recent abdominal pain attacks she presented to the ER. Patient would laboratory studies and CT scan. CT scan showed gallstones and a incisional hernia lower midline incision. I reviewed these scans personally. Laboratory studies showed normal LFTs.  Aside from open abdominal hysterectomy patient had no previous abdominal surgeries.    Past Surgical History (Tanisha A. Manson PasseyBrown, RMA; 10/06/2017 1:48 PM) Cataract Surgery  Bilateral. Colon Polyp Removal - Colonoscopy  Hysterectomy (not due to cancer) - Complete  Knee Surgery  Left.  Diagnostic Studies History (Tanisha A. Manson PasseyBrown, RMA; 10/06/2017 1:48 PM) Colonoscopy  5-10 years ago Mammogram  1-3 years ago  Allergies (Tanisha A. Manson PasseyBrown, RMA; 10/06/2017 1:50 PM) Dyes  OxyCODONE HCl *ANALGESICS - OPIOID*  Hydrocodone-Acetaminophen *ANALGESICS - OPIOID*  Allergies Reconciled   Medication History (Tanisha A. Manson PasseyBrown, RMA; 10/06/2017 1:51 PM) AmLODIPine Besylate (10MG  Tablet, Oral) Active. Losartan Potassium (100MG  Tablet, Oral) Active. Lovastatin (40MG  Tablet, Oral) Active. Potassium Chloride ER (10MEQ Capsule ER, Oral) Active. TraZODone HCl (50MG  Tablet, Oral) Active. CoQ10 (Oral) Specific strength unknown - Active. Krill Oil (Oral) Specific strength unknown - Active. Medications Reconciled  Social History (Tanisha A. Manson PasseyBrown, RMA;  10/06/2017 1:48 PM) Alcohol use  Occasional alcohol use. Caffeine use  Tea. Tobacco use  Never smoker.  Family History (Tanisha A. Manson PasseyBrown, RMA; 10/06/2017 1:48 PM) Arthritis  Father. Cerebrovascular Accident  Father.  Pregnancy / Birth History (Tanisha A. Manson PasseyBrown, RMA; 10/06/2017 1:48 PM) Age at menarche  15 years. Age of menopause  7746-50 Gravida  7 Length (months) of breastfeeding  3-6 Maternal age  81-20 Para  4  Other Problems (Tanisha A. Manson PasseyBrown, RMA; 10/06/2017 1:48 PM) Hemorrhoids  Hepatitis  Hypercholesterolemia     Review of Systems Axel Filler(Dinisha Cai MD; 10/06/2017 2:01 PM) General Not Present- Appetite Loss, Chills, Fatigue, Fever, Night Sweats, Weight Gain and Weight Loss. Skin Not Present- Change in Wart/Mole, Dryness, Hives, Jaundice, New Lesions, Non-Healing Wounds, Rash and Ulcer. HEENT Not Present- Earache, Hearing Loss, Hoarseness, Nose Bleed, Oral Ulcers, Ringing in the Ears, Seasonal Allergies, Sinus Pain, Sore Throat, Visual Disturbances, Wears glasses/contact lenses and Yellow Eyes. Respiratory Present- Snoring. Not Present- Bloody sputum, Chronic Cough, Difficulty Breathing and Wheezing. Breast Not Present- Breast Mass, Breast Pain, Nipple Discharge and Skin Changes. Cardiovascular Not Present- Chest Pain, Difficulty Breathing Lying Down, Leg Cramps, Palpitations, Rapid Heart Rate, Shortness of Breath and Swelling of Extremities. Gastrointestinal Present- Change in Bowel Habits, Hemorrhoids and Vomiting. Not Present- Abdominal Pain, Bloating, Bloody Stool, Chronic diarrhea, Constipation, Difficulty Swallowing, Excessive gas, Gets full quickly at meals, Indigestion, Nausea and Rectal Pain. Female Genitourinary Not Present- Frequency, Nocturia, Painful Urination, Pelvic Pain and Urgency. Musculoskeletal Not Present- Back Pain, Joint Pain, Joint Stiffness, Muscle Pain, Muscle Weakness and Swelling of Extremities. Neurological Not Present- Decreased Memory,  Fainting, Headaches, Numbness, Seizures, Tingling, Tremor, Trouble walking and Weakness. Psychiatric Not Present- Anxiety, Bipolar, Change in Sleep Pattern, Depression, Fearful and Frequent crying. Endocrine Not Present- Cold Intolerance, Excessive Hunger, Hair  Changes, Heat Intolerance, Hot flashes and New Diabetes. Hematology Not Present- Blood Thinners, Easy Bruising, Excessive bleeding, Gland problems, HIV and Persistent Infections. All other systems negative  Vitals (Tanisha A. Brown RMA; 10/06/2017 1:50 PM) 10/06/2017 1:49 PM Weight: 152.4 lb Height: 61in Body Surface Area: 1.68 m Body Mass Index: 28.8 kg/m  Temp.: 98.45F  Pulse: 86 (Regular)  BP: 132/84 (Sitting, Left Arm, Standard)       Physical Exam Axel Filler MD; 10/06/2017 2:03 PM) The physical exam findings are as follows: Note:Constitutional: No acute distress, conversant, appears stated age  Eyes: Anicteric sclerae, moist conjunctiva, no lid lag  Neck: No thyromegaly, trachea midline, no cervical lymphadenopathy  Lungs: Clear to auscultation biilaterally, normal respiratory effot  Cardiovascular: regular rate & rhythm, no murmurs, no peripheal edema, pedal pulses 2+  GI: Soft, no masses or hepatosplenomegaly, non-tender to palpation, lower midline incisional hernia  MSK: Normal gait, no clubbing cyanosis, edema  Skin: No rashes, palpation reveals normal skin turgor  Psychiatric: Appropriate judgment and insight, oriented to person, place, and time    Assessment & Plan Axel Filler MD; 10/06/2017 2:03 PM) SYMPTOMATIC CHOLELITHIASIS (K80.20) Impression: 83 year old female with symptomatic cholelithiasis  1. We will proceed to the operating room for a laparoscopic cholecystectomy  2. Risks and benefits were discussed with the patient to generally include, but not limited to: infection, bleeding, possible need for post op ERCP, damage to the bile ducts, bile leak, and possible need for  further surgery. Alternatives were offered and described. All questions were answered and the patient voiced understanding of the procedure and wishes to proceed at this point with a laparoscopic cholecystectomy

## 2017-10-12 DIAGNOSIS — K8010 Calculus of gallbladder with chronic cholecystitis without obstruction: Secondary | ICD-10-CM | POA: Diagnosis not present

## 2017-10-25 HISTORY — PX: CHOLECYSTECTOMY: SHX55

## 2017-12-16 DIAGNOSIS — R7309 Other abnormal glucose: Secondary | ICD-10-CM | POA: Diagnosis not present

## 2017-12-16 DIAGNOSIS — I10 Essential (primary) hypertension: Secondary | ICD-10-CM | POA: Diagnosis not present

## 2017-12-16 DIAGNOSIS — M25561 Pain in right knee: Secondary | ICD-10-CM | POA: Diagnosis not present

## 2017-12-16 DIAGNOSIS — E782 Mixed hyperlipidemia: Secondary | ICD-10-CM | POA: Diagnosis not present

## 2017-12-16 DIAGNOSIS — Z1389 Encounter for screening for other disorder: Secondary | ICD-10-CM | POA: Diagnosis not present

## 2017-12-16 DIAGNOSIS — J309 Allergic rhinitis, unspecified: Secondary | ICD-10-CM | POA: Diagnosis not present

## 2017-12-16 DIAGNOSIS — R05 Cough: Secondary | ICD-10-CM | POA: Diagnosis not present

## 2017-12-16 DIAGNOSIS — E2839 Other primary ovarian failure: Secondary | ICD-10-CM | POA: Diagnosis not present

## 2017-12-23 ENCOUNTER — Other Ambulatory Visit: Payer: Self-pay | Admitting: Internal Medicine

## 2017-12-23 DIAGNOSIS — E2839 Other primary ovarian failure: Secondary | ICD-10-CM

## 2018-02-03 DIAGNOSIS — G8929 Other chronic pain: Secondary | ICD-10-CM | POA: Diagnosis not present

## 2018-02-03 DIAGNOSIS — M1711 Unilateral primary osteoarthritis, right knee: Secondary | ICD-10-CM | POA: Diagnosis not present

## 2018-02-03 DIAGNOSIS — M170 Bilateral primary osteoarthritis of knee: Secondary | ICD-10-CM | POA: Diagnosis not present

## 2018-02-03 DIAGNOSIS — Z96652 Presence of left artificial knee joint: Secondary | ICD-10-CM | POA: Diagnosis not present

## 2018-02-11 DIAGNOSIS — M25561 Pain in right knee: Secondary | ICD-10-CM | POA: Diagnosis not present

## 2018-02-11 DIAGNOSIS — I10 Essential (primary) hypertension: Secondary | ICD-10-CM | POA: Diagnosis not present

## 2018-02-11 DIAGNOSIS — Z01818 Encounter for other preprocedural examination: Secondary | ICD-10-CM | POA: Diagnosis not present

## 2018-03-03 DIAGNOSIS — M1990 Unspecified osteoarthritis, unspecified site: Secondary | ICD-10-CM | POA: Diagnosis not present

## 2018-03-03 DIAGNOSIS — I10 Essential (primary) hypertension: Secondary | ICD-10-CM | POA: Diagnosis not present

## 2018-03-03 DIAGNOSIS — E785 Hyperlipidemia, unspecified: Secondary | ICD-10-CM | POA: Diagnosis not present

## 2018-03-03 DIAGNOSIS — Z01810 Encounter for preprocedural cardiovascular examination: Secondary | ICD-10-CM | POA: Diagnosis not present

## 2018-04-01 DIAGNOSIS — M545 Low back pain: Secondary | ICD-10-CM | POA: Diagnosis not present

## 2018-04-01 DIAGNOSIS — M1711 Unilateral primary osteoarthritis, right knee: Secondary | ICD-10-CM | POA: Diagnosis not present

## 2018-04-29 DIAGNOSIS — M1711 Unilateral primary osteoarthritis, right knee: Secondary | ICD-10-CM | POA: Diagnosis not present

## 2018-06-09 DIAGNOSIS — I10 Essential (primary) hypertension: Secondary | ICD-10-CM | POA: Diagnosis not present

## 2018-06-09 DIAGNOSIS — Z01810 Encounter for preprocedural cardiovascular examination: Secondary | ICD-10-CM | POA: Diagnosis not present

## 2018-06-09 DIAGNOSIS — E785 Hyperlipidemia, unspecified: Secondary | ICD-10-CM | POA: Diagnosis not present

## 2018-06-09 DIAGNOSIS — M1990 Unspecified osteoarthritis, unspecified site: Secondary | ICD-10-CM | POA: Diagnosis not present

## 2018-06-20 ENCOUNTER — Encounter: Payer: Self-pay | Admitting: Nurse Practitioner

## 2018-06-20 ENCOUNTER — Ambulatory Visit (INDEPENDENT_AMBULATORY_CARE_PROVIDER_SITE_OTHER): Payer: Medicare HMO | Admitting: Nurse Practitioner

## 2018-06-20 VITALS — BP 116/62 | HR 61 | Temp 98.4°F | Ht 61.0 in | Wt 151.0 lb

## 2018-06-20 DIAGNOSIS — I10 Essential (primary) hypertension: Secondary | ICD-10-CM

## 2018-06-20 DIAGNOSIS — E785 Hyperlipidemia, unspecified: Secondary | ICD-10-CM | POA: Diagnosis not present

## 2018-06-20 LAB — CMP14 + ANION GAP
A/G RATIO: 1.8 (ref 1.2–2.2)
ALBUMIN: 4.3 g/dL (ref 3.5–4.7)
ALT: 10 IU/L (ref 0–32)
ANION GAP: 16 mmol/L (ref 10.0–18.0)
AST: 16 IU/L (ref 0–40)
Alkaline Phosphatase: 89 IU/L (ref 39–117)
BUN / CREAT RATIO: 12 (ref 12–28)
BUN: 10 mg/dL (ref 8–27)
Bilirubin Total: 0.7 mg/dL (ref 0.0–1.2)
CALCIUM: 9.9 mg/dL (ref 8.7–10.3)
CO2: 25 mmol/L (ref 20–29)
CREATININE: 0.84 mg/dL (ref 0.57–1.00)
Chloride: 104 mmol/L (ref 96–106)
GFR, EST AFRICAN AMERICAN: 75 mL/min/{1.73_m2} (ref >59)
GFR, EST NON AFRICAN AMERICAN: 65 mL/min/{1.73_m2} (ref >59)
GLOBULIN, TOTAL: 2.4 g/dL (ref 1.5–4.5)
Glucose: 88 mg/dL (ref 65–99)
POTASSIUM: 3.8 mmol/L (ref 3.5–5.2)
SODIUM: 145 mmol/L — AB (ref 134–144)
Total Protein: 6.7 g/dL (ref 6.0–8.5)

## 2018-06-20 LAB — LIPID PANEL
CHOL/HDL RATIO: 2.3 ratio (ref 0.0–4.4)
Cholesterol, Total: 118 mg/dL (ref 100–199)
HDL: 52 mg/dL (ref >39)
LDL CALC: 53 mg/dL (ref 0–99)
Triglycerides: 64 mg/dL (ref 0–149)
VLDL Cholesterol Cal: 13 mg/dL (ref 5–40)

## 2018-06-20 MED ORDER — LOVASTATIN 40 MG PO TABS: 40.0000 mg | ORAL_TABLET | Freq: Every day | ORAL | 1 refills | Status: DC

## 2018-06-20 MED ORDER — AMLODIPINE BESYLATE 10 MG PO TABS: 10.0000 mg | ORAL_TABLET | Freq: Every day | ORAL | 1 refills | Status: DC

## 2018-06-20 MED ORDER — LOSARTAN POTASSIUM 100 MG PO TABS: 100.0000 mg | ORAL_TABLET | Freq: Every day | ORAL | 1 refills | Status: DC

## 2018-06-20 NOTE — Progress Notes (Signed)
Subjective:     Patient ID: Tara Collins , female    DOB: [birth year] , 83 y.o.   MRN: [id number]   Chief Complaint  Patient presents with  . Hypertension    HPI  Hyperlipidemia - She is doing well with her cholesterol medications.  No current concerns.   Hypertension  This is a chronic problem. The current episode started more than 1 year ago. The problem is controlled. Pertinent negatives include no anxiety, chest pain, headaches, malaise/fatigue, palpitations or shortness of breath. Risk factors for coronary artery disease include dyslipidemia and obesity. The current treatment provides moderate improvement. There are no compliance problems.  There is no history of angina or kidney disease. There is no history of chronic renal disease.     No past medical history on file.   No family history on file.   Current Outpatient Medications:  .  amLODipine (NORVASC) 10 MG tablet, Take 10 mg by mouth daily., Disp: , Rfl: 0 .  Coenzyme Q10 (COQ10 PO), Take 1 tablet by mouth daily., Disp: , Rfl:  .  losartan (COZAAR) 100 MG tablet, Take 100 mg by mouth daily., Disp: , Rfl: 0 .  lovastatin (MEVACOR) 40 MG tablet, Take 40 mg by mouth daily., Disp: , Rfl: 0 .  potassium chloride (MICRO-K) 10 MEQ CR capsule, Take 10 mEq by mouth daily., Disp: , Rfl: 0   Allergies  Allergen Reactions  . Contrast Media [Iodinated Diagnostic Agents] Shortness Of Breath    ALLERGY ADDED FROM QUESTIONING PT  SHE HAD A PREVIOUS REACTION TO CONTRAST      Review of Systems  Constitutional: Negative.  Negative for fatigue and malaise/fatigue.  Eyes: Negative for visual disturbance.  Respiratory: Negative.  Negative for shortness of breath.   Cardiovascular: Negative.  Negative for chest pain, palpitations and leg swelling.  Gastrointestinal: Negative.   Endocrine: Negative.   Musculoskeletal: Negative.   Skin: Negative.   Neurological: Negative for dizziness, weakness and headaches.  Psychiatric/Behavioral:  Negative for confusion. The patient is not nervous/anxious.      Today's Vitals   06/20/18 0839  BP: 116/62  Pulse: 61  Temp: 98.4 F (36.9 C)  TempSrc: Oral  SpO2: 96%  Weight: 151 lb (68.5 kg)  Height: 5\' 1"  (1.549 m)  PainSc: 0-No pain   Body mass index is 28.53 kg/m.   Objective:  Physical Exam  Constitutional: She is oriented to person, place, and time. She appears well-developed and well-nourished.  Eyes: Pupils are equal, round, and reactive to light.  Cardiovascular: Normal rate, regular rhythm, normal heart sounds and intact distal pulses.  No murmur heard. Pulmonary/Chest: Effort normal and breath sounds normal.  Musculoskeletal: Normal range of motion. She exhibits no edema.  Neurological: She is alert and oriented to person, place, and time. No cranial nerve deficit.  Skin: Skin is warm and dry. Capillary refill takes less than 2 seconds.  Psychiatric: She has a normal mood and affect.  Vitals reviewed.       Assessment And Plan:     1. Essential hypertension  Chronic  B/P is well controlled.   CMP ordered to check renal function.  The importance of regular exercise and dietary modification was stressed to the patient. Stressed importance of losing ten percent of her body weight to help with B/P control. The weight loss would help with decreasing cardiac and cancer risk as well.   Continue current medications - CMP14 + Anion Gap - Lipid panel - losartan (COZAAR) 100  MG tablet; Take 1 tablet (100 mg total) by mouth daily.  Dispense: 90 tablet; Refill: 1 - lovastatin (MEVACOR) 40 MG tablet; Take 1 tablet (40 mg total) by mouth daily.  Dispense: 90 tablet; Refill: 1 - amLODipine (NORVASC) 10 MG tablet; Take 1 tablet (10 mg total) by mouth daily.  Dispense: 90 tablet; Refill: 1  2. Dyslipidemia  Chronic, controlled  Continue with current medications     Arnette FeltsJanece Qiana Landgrebe, FNP

## 2018-06-22 NOTE — Progress Notes (Signed)
09-19-17 (Epic) EKG

## 2018-06-27 ENCOUNTER — Inpatient Hospital Stay (HOSPITAL_COMMUNITY)
Admission: RE | Admit: 2018-06-27 | Discharge: 2018-06-27 | Disposition: A | Discharge status: Home / Self Care | Payer: Medicaid Other | Source: Ambulatory Visit

## 2018-06-28 ENCOUNTER — Encounter (HOSPITAL_COMMUNITY)
Admission: RE | Admit: 2018-06-28 | Discharge: 2018-06-28 | Disposition: A | Discharge status: Home / Self Care | Payer: Medicare HMO | Source: Ambulatory Visit | Attending: Orthopedic Surgery | Admitting: Orthopedic Surgery

## 2018-06-28 ENCOUNTER — Other Ambulatory Visit: Payer: Self-pay

## 2018-06-28 ENCOUNTER — Encounter (HOSPITAL_COMMUNITY): Payer: Self-pay

## 2018-06-28 DIAGNOSIS — Z01812 Encounter for preprocedural laboratory examination: Secondary | ICD-10-CM | POA: Insufficient documentation

## 2018-06-28 HISTORY — DX: Essential (primary) hypertension: I10

## 2018-06-28 HISTORY — DX: Unspecified viral hepatitis B without hepatic coma: B19.10

## 2018-06-28 HISTORY — DX: Other specified postprocedural states: Z98.890

## 2018-06-28 HISTORY — DX: Unspecified osteoarthritis, unspecified site: M19.90

## 2018-06-28 HISTORY — DX: Other seasonal allergic rhinitis: J30.2

## 2018-06-28 HISTORY — DX: Other specified postprocedural states: R11.2

## 2018-06-28 LAB — BASIC METABOLIC PANEL
Anion gap: 6 (ref 5–15)
BUN: 14 mg/dL (ref 8–23)
CALCIUM: 9.8 mg/dL (ref 8.9–10.3)
CO2: 30 mmol/L (ref 22–32)
Chloride: 105 mmol/L (ref 98–111)
Creatinine, Ser: 1 mg/dL (ref 0.44–1.00)
GFR calc Af Amer: >60 mL/min (ref >60)
GFR calc non Af Amer: 53 mL/min — ABNORMAL LOW (ref >60)
Glucose, Bld: 98 mg/dL (ref 70–99)
Potassium: 3.7 mmol/L (ref 3.5–5.1)
SODIUM: 141 mmol/L (ref 135–145)

## 2018-06-28 LAB — SURGICAL PCR SCREEN
MRSA, PCR: NEGATIVE
Staphylococcus aureus: NEGATIVE

## 2018-06-28 LAB — CBC
HCT: 43 % (ref 36.0–46.0)
Hemoglobin: 14.1 g/dL (ref 12.0–15.0)
MCH: 34 pg (ref 26.0–34.0)
MCHC: 32.8 g/dL (ref 30.0–36.0)
MCV: 103.6 fL — ABNORMAL HIGH (ref 80.0–100.0)
Platelets: 255 10*3/uL (ref 150–400)
RBC: 4.15 MIL/uL (ref 3.87–5.11)
RDW: 12.3 % (ref 11.5–15.5)
WBC: 5.6 10*3/uL (ref 4.0–10.5)
nRBC: 0 % (ref 0.0–0.2)

## 2018-06-28 NOTE — Patient Instructions (Addendum)
Tara AsaLucy L Collins  06/28/2018   Your procedure is scheduled on: 07/05/2018   [facility]  [facility]  Report to admitting at  0800 AM    Call this number if you have problems the morning of surgery [phone]   Remember: Do not eat food or drink liquids :After Midnight. BRUSH YOUR TEETH MORNING OF SURGERY AND RINSE YOUR MOUTH OUT, NO CHEWING GUM CANDY OR MINTS.     Take these medicines the morning of surgery with A SIP OF WATER: Amlodipine (Norvasc)                                 You may not have any metal on your body including hair pins and              piercings  Do not wear jewelry, make-up, lotions, powders or perfumes, deodorant             Do not wear nail polish.  Do not shave  48 hours prior to surgery.                 Do not bring valuables to the hospital. [facility] IS NOT             RESPONSIBLE   FOR VALUABLES.  Contacts, dentures or bridgework may not be worn into surgery.  Leave suitcase in the car. After surgery it may be brought to your room.                  Please read over the following fact sheets you were given: _____________________________________________________________________             [facility] - Preparing for [facility] Before surgery, you can play an important role.  Because skin is not sterile, your skin needs to be as free of germs as possible.  You can reduce the number of germs on your skin by washing with CHG (chlorahexidine gluconate) soap before surgery.  CHG is an antiseptic cleaner which kills germs and bonds with the skin to continue killing germs even after washing. Please DO NOT use if you have an allergy to CHG or antibacterial soaps.  If your skin becomes reddened/irritated stop using the CHG and inform your nurse when you arrive at Short Stay. Do not shave (including legs and underarms) for at least 48 hours prior to the first CHG shower.  You may shave your face/neck. Please follow these  instructions carefully:  1.  Shower with CHG Soap the night before surgery and the  morning of Surgery.  2.  If you choose to wash your hair, wash your hair first as usual with your  normal  shampoo.  3.  After you shampoo, rinse your hair and body thoroughly to remove the  shampoo.                           4.  Use CHG as you would any other liquid soap.  You can apply chg directly  to the skin and wash                       Gently with a scrungie or clean washcloth.  5.  Apply the CHG Soap to your body ONLY FROM THE NECK DOWN.  Do not use on face/ open                           Wound or open sores. Avoid contact with eyes, ears mouth and genitals (private parts).                       Wash face,  Genitals (private parts) with your normal soap.             6.  Wash thoroughly, paying special attention to the area where your surgery  will be performed.  7.  Thoroughly rinse your body with warm water from the neck down.  8.  DO NOT shower/wash with your normal soap after using and rinsing off  the CHG Soap.                9.  Pat yourself dry with a clean towel.            10.  Wear clean pajamas.            11.  Place clean sheets on your bed the night of your first shower and do not  sleep with pets. Day of Surgery : Do not apply any lotions/deodorants the morning of surgery.  Please wear clean clothes to the hospital/surgery center.  FAILURE TO FOLLOW THESE INSTRUCTIONS MAY RESULT IN THE CANCELLATION OF YOUR SURGERY PATIENT SIGNATURE_________________________________  NURSE SIGNATURE__________________________________  ________________________________________________________________________

## 2018-07-05 ENCOUNTER — Observation Stay (HOSPITAL_COMMUNITY)
Admission: RE | Admit: 2018-07-05 | Discharge: 2018-07-06 | Disposition: A | Discharge status: Home / Self Care | Payer: Medicare HMO | Attending: Orthopedic Surgery | Admitting: Orthopedic Surgery

## 2018-07-05 ENCOUNTER — Ambulatory Visit (HOSPITAL_COMMUNITY): Payer: Medicare HMO | Admitting: Registered Nurse

## 2018-07-05 ENCOUNTER — Observation Stay (HOSPITAL_COMMUNITY): Payer: Medicare HMO

## 2018-07-05 ENCOUNTER — Other Ambulatory Visit: Payer: Self-pay

## 2018-07-05 ENCOUNTER — Encounter (HOSPITAL_COMMUNITY)
Admission: RE | Disposition: A | Discharge status: Home / Self Care | Payer: Self-pay | Source: Home / Self Care | Attending: Orthopedic Surgery

## 2018-07-05 ENCOUNTER — Encounter (HOSPITAL_COMMUNITY): Payer: Self-pay | Admitting: *Deleted

## 2018-07-05 DIAGNOSIS — E785 Hyperlipidemia, unspecified: Secondary | ICD-10-CM | POA: Diagnosis not present

## 2018-07-05 DIAGNOSIS — Z96651 Presence of right artificial knee joint: Secondary | ICD-10-CM

## 2018-07-05 DIAGNOSIS — M1711 Unilateral primary osteoarthritis, right knee: Secondary | ICD-10-CM | POA: Diagnosis not present

## 2018-07-05 DIAGNOSIS — Z471 Aftercare following joint replacement surgery: Secondary | ICD-10-CM | POA: Diagnosis not present

## 2018-07-05 DIAGNOSIS — I10 Essential (primary) hypertension: Secondary | ICD-10-CM | POA: Insufficient documentation

## 2018-07-05 DIAGNOSIS — Z79899 Other long term (current) drug therapy: Secondary | ICD-10-CM | POA: Insufficient documentation

## 2018-07-05 DIAGNOSIS — G8918 Other acute postprocedural pain: Secondary | ICD-10-CM | POA: Diagnosis not present

## 2018-07-05 HISTORY — PX: PARTIAL KNEE ARTHROPLASTY: SHX2174

## 2018-07-05 HISTORY — DX: Unilateral primary osteoarthritis, right knee: M17.11

## 2018-07-05 SURGERY — ARTHROPLASTY, KNEE, UNICOMPARTMENTAL
Anesthesia: Spinal | Site: Knee | Laterality: Right

## 2018-07-05 MED ORDER — ONDANSETRON HCL 4 MG PO TABS: 4.0000 mg | ORAL_TABLET | Freq: Four times a day (QID) | ORAL | Status: DC | PRN

## 2018-07-05 MED ORDER — BISACODYL 10 MG RE SUPP: 10.0000 mg | Freq: Every day | RECTAL | Status: DC | PRN

## 2018-07-05 MED ORDER — ACETAMINOPHEN 500 MG PO TABS
1000.0000 mg | ORAL_TABLET | Freq: Once | ORAL | Status: AC
  Administered 2018-07-05: 1000 mg via ORAL
  Filled 2018-07-05: qty 2

## 2018-07-05 MED ORDER — MORPHINE SULFATE (PF) 2 MG/ML IV SOLN: 0.5000 mg | INTRAVENOUS | Status: DC | PRN

## 2018-07-05 MED ORDER — OXYCODONE HCL 5 MG PO TABS: 5.0000 mg | ORAL_TABLET | Freq: Once | ORAL | Status: DC | PRN

## 2018-07-05 MED ORDER — ALUM & MAG HYDROXIDE-SIMETH 200-200-20 MG/5ML PO SUSP: 30.0000 mL | ORAL | Status: DC | PRN

## 2018-07-05 MED ORDER — ASPIRIN EC 325 MG PO TBEC
325.0000 mg | DELAYED_RELEASE_TABLET | Freq: Two times a day (BID) | ORAL | Status: DC
  Administered 2018-07-06: 325 mg via ORAL
  Filled 2018-07-05: qty 1

## 2018-07-05 MED ORDER — DIPHENHYDRAMINE HCL 12.5 MG/5ML PO ELIX: 12.5000 mg | ORAL_SOLUTION | ORAL | Status: DC | PRN

## 2018-07-05 MED ORDER — LOSARTAN POTASSIUM 50 MG PO TABS: 100.0000 mg | ORAL_TABLET | Freq: Every day | ORAL | Status: DC

## 2018-07-05 MED ORDER — FENTANYL CITRATE (PF) 100 MCG/2ML IJ SOLN
INTRAMUSCULAR | Status: AC
  Filled 2018-07-05: qty 2

## 2018-07-05 MED ORDER — ONDANSETRON HCL 4 MG/2ML IJ SOLN: 4.0000 mg | Freq: Four times a day (QID) | INTRAMUSCULAR | Status: DC | PRN

## 2018-07-05 MED ORDER — BUPIVACAINE HCL (PF) 0.25 % IJ SOLN
INTRAMUSCULAR | Status: AC
  Filled 2018-07-05: qty 30

## 2018-07-05 MED ORDER — STERILE WATER FOR IRRIGATION IR SOLN
Status: DC | PRN
  Administered 2018-07-05: 1000 mL

## 2018-07-05 MED ORDER — METOCLOPRAMIDE HCL 5 MG PO TABS: 5.0000 mg | ORAL_TABLET | Freq: Three times a day (TID) | ORAL | Status: DC | PRN

## 2018-07-05 MED ORDER — HYDROCODONE-ACETAMINOPHEN 10-325 MG PO TABS: 1.0000 | ORAL_TABLET | Freq: Four times a day (QID) | ORAL | 0 refills | Status: DC | PRN

## 2018-07-05 MED ORDER — SODIUM CHLORIDE 0.9 % IV SOLN
INTRAVENOUS | Status: DC | PRN
  Administered 2018-07-05: 25 ug/min via INTRAVENOUS

## 2018-07-05 MED ORDER — HYDROCODONE-ACETAMINOPHEN 5-325 MG PO TABS
1.0000 | ORAL_TABLET | ORAL | Status: DC | PRN
  Administered 2018-07-05: 2 via ORAL
  Filled 2018-07-05: qty 2

## 2018-07-05 MED ORDER — ONDANSETRON HCL 4 MG PO TABS: 4.0000 mg | ORAL_TABLET | Freq: Three times a day (TID) | ORAL | 0 refills | Status: DC | PRN

## 2018-07-05 MED ORDER — BUPIVACAINE HCL (PF) 0.75 % IJ SOLN
INTRAMUSCULAR | Status: DC | PRN
  Administered 2018-07-05: 1.6 mL via INTRATHECAL

## 2018-07-05 MED ORDER — PROPOFOL 500 MG/50ML IV EMUL
INTRAVENOUS | Status: DC | PRN
  Administered 2018-07-05: 75 ug/kg/min via INTRAVENOUS

## 2018-07-05 MED ORDER — ROPIVACAINE HCL 5 MG/ML IJ SOLN
INTRAMUSCULAR | Status: DC | PRN
  Administered 2018-07-05: 20 mL via PERINEURAL

## 2018-07-05 MED ORDER — TRAZODONE HCL 50 MG PO TABS
50.0000 mg | ORAL_TABLET | Freq: Every day | ORAL | Status: DC
  Administered 2018-07-05: 50 mg via ORAL
  Filled 2018-07-05: qty 1

## 2018-07-05 MED ORDER — TRANEXAMIC ACID-NACL 1000-0.7 MG/100ML-% IV SOLN
1000.0000 mg | INTRAVENOUS | Status: AC
  Administered 2018-07-05: 1000 mg via INTRAVENOUS
  Filled 2018-07-05 (×2): qty 100

## 2018-07-05 MED ORDER — MIDAZOLAM HCL 2 MG/2ML IJ SOLN
INTRAMUSCULAR | Status: AC
  Filled 2018-07-05: qty 2

## 2018-07-05 MED ORDER — SENNA-DOCUSATE SODIUM 8.6-50 MG PO TABS: 2.0000 | ORAL_TABLET | Freq: Every day | ORAL | 1 refills | Status: DC

## 2018-07-05 MED ORDER — MENTHOL 3 MG MT LOZG: 1.0000 | LOZENGE | OROMUCOSAL | Status: DC | PRN

## 2018-07-05 MED ORDER — ACETAMINOPHEN 500 MG PO TABS
500.0000 mg | ORAL_TABLET | Freq: Four times a day (QID) | ORAL | Status: AC
  Administered 2018-07-05 – 2018-07-06 (×4): 500 mg via ORAL
  Filled 2018-07-05 (×4): qty 1

## 2018-07-05 MED ORDER — PHENOL 1.4 % MT LIQD
1.0000 | OROMUCOSAL | Status: DC | PRN
  Filled 2018-07-05: qty 177

## 2018-07-05 MED ORDER — POTASSIUM CHLORIDE CRYS ER 10 MEQ PO TBCR
10.0000 meq | EXTENDED_RELEASE_TABLET | Freq: Every day | ORAL | Status: DC
  Administered 2018-07-05 – 2018-07-06 (×2): 10 meq via ORAL
  Filled 2018-07-05 (×2): qty 1

## 2018-07-05 MED ORDER — SODIUM CHLORIDE 0.9 % IR SOLN
Status: DC | PRN
  Administered 2018-07-05: 1000 mL

## 2018-07-05 MED ORDER — PRAVASTATIN SODIUM 20 MG PO TABS
20.0000 mg | ORAL_TABLET | Freq: Every day | ORAL | Status: DC
  Administered 2018-07-05: 20 mg via ORAL
  Filled 2018-07-05: qty 1

## 2018-07-05 MED ORDER — ASPIRIN EC 325 MG PO TBEC: 325.0000 mg | DELAYED_RELEASE_TABLET | Freq: Two times a day (BID) | ORAL | 0 refills | Status: DC

## 2018-07-05 MED ORDER — GLYCERIN-HYPROMELLOSE-PEG 400 0.2-0.2-1 % OP SOLN
1.0000 [drp] | Freq: Every day | OPHTHALMIC | Status: DC | PRN
  Filled 2018-07-05: qty 15

## 2018-07-05 MED ORDER — TRANEXAMIC ACID-NACL 1000-0.7 MG/100ML-% IV SOLN
1000.0000 mg | Freq: Once | INTRAVENOUS | Status: AC
  Administered 2018-07-05: 1000 mg via INTRAVENOUS
  Filled 2018-07-05: qty 100

## 2018-07-05 MED ORDER — EPHEDRINE SULFATE 50 MG/ML IJ SOLN
INTRAMUSCULAR | Status: DC | PRN
  Administered 2018-07-05: 10 mg via INTRAVENOUS

## 2018-07-05 MED ORDER — LOSARTAN POTASSIUM 50 MG PO TABS
100.0000 mg | ORAL_TABLET | Freq: Every day | ORAL | Status: DC
  Administered 2018-07-05 – 2018-07-06 (×2): 100 mg via ORAL
  Filled 2018-07-05 (×2): qty 2

## 2018-07-05 MED ORDER — PROMETHAZINE HCL 25 MG/ML IJ SOLN: 6.2500 mg | INTRAMUSCULAR | Status: DC | PRN

## 2018-07-05 MED ORDER — METOCLOPRAMIDE HCL 5 MG/ML IJ SOLN: 5.0000 mg | Freq: Three times a day (TID) | INTRAMUSCULAR | Status: DC | PRN

## 2018-07-05 MED ORDER — POLYETHYLENE GLYCOL 3350 17 G PO PACK: 17.0000 g | PACK | Freq: Every day | ORAL | Status: DC | PRN

## 2018-07-05 MED ORDER — DEXAMETHASONE SODIUM PHOSPHATE 10 MG/ML IJ SOLN: 8.0000 mg | Freq: Once | INTRAMUSCULAR | Status: AC

## 2018-07-05 MED ORDER — KETOROLAC TROMETHAMINE 30 MG/ML IJ SOLN
INTRAMUSCULAR | Status: DC | PRN
  Administered 2018-07-05: 30 mg

## 2018-07-05 MED ORDER — DEXAMETHASONE SODIUM PHOSPHATE 10 MG/ML IJ SOLN
INTRAMUSCULAR | Status: DC | PRN
  Administered 2018-07-05: 8 mg via INTRAVENOUS

## 2018-07-05 MED ORDER — AMLODIPINE BESYLATE 10 MG PO TABS
10.0000 mg | ORAL_TABLET | Freq: Every day | ORAL | Status: DC
  Administered 2018-07-06: 10 mg via ORAL
  Filled 2018-07-05: qty 1

## 2018-07-05 MED ORDER — HYDROCODONE-ACETAMINOPHEN 7.5-325 MG PO TABS
1.0000 | ORAL_TABLET | ORAL | Status: DC | PRN
  Administered 2018-07-06: 2 via ORAL
  Filled 2018-07-05: qty 2

## 2018-07-05 MED ORDER — KETOROLAC TROMETHAMINE 15 MG/ML IJ SOLN
7.5000 mg | Freq: Four times a day (QID) | INTRAMUSCULAR | Status: AC
  Administered 2018-07-05 – 2018-07-06 (×4): 7.5 mg via INTRAVENOUS
  Filled 2018-07-05 (×4): qty 1

## 2018-07-05 MED ORDER — DEXAMETHASONE SODIUM PHOSPHATE 10 MG/ML IJ SOLN
10.0000 mg | Freq: Once | INTRAMUSCULAR | Status: AC
  Administered 2018-07-06: 10 mg via INTRAVENOUS
  Filled 2018-07-05: qty 1

## 2018-07-05 MED ORDER — PHENYLEPHRINE HCL 10 MG/ML IJ SOLN
INTRAMUSCULAR | Status: AC
  Filled 2018-07-05: qty 1

## 2018-07-05 MED ORDER — FENTANYL CITRATE (PF) 100 MCG/2ML IJ SOLN
50.0000 ug | INTRAMUSCULAR | Status: DC
  Administered 2018-07-05: 50 ug via INTRAVENOUS
  Filled 2018-07-05: qty 2

## 2018-07-05 MED ORDER — POTASSIUM CHLORIDE IN NACL 20-0.45 MEQ/L-% IV SOLN
INTRAVENOUS | Status: DC
  Administered 2018-07-05 – 2018-07-06 (×2): via INTRAVENOUS
  Filled 2018-07-05 (×3): qty 1000

## 2018-07-05 MED ORDER — ONDANSETRON HCL 4 MG/2ML IJ SOLN
INTRAMUSCULAR | Status: AC
  Filled 2018-07-05: qty 2

## 2018-07-05 MED ORDER — CARBOXYMETHYLCELLULOSE SODIUM 0.5 % OP SOLN: 1.0000 [drp] | Freq: Every day | OPHTHALMIC | Status: DC | PRN

## 2018-07-05 MED ORDER — FENTANYL CITRATE (PF) 100 MCG/2ML IJ SOLN
INTRAMUSCULAR | Status: DC | PRN
  Administered 2018-07-05: 50 ug via INTRAVENOUS

## 2018-07-05 MED ORDER — BUPIVACAINE HCL (PF) 0.25 % IJ SOLN
INTRAMUSCULAR | Status: DC | PRN
  Administered 2018-07-05: 30 mL

## 2018-07-05 MED ORDER — MIDAZOLAM HCL 2 MG/2ML IJ SOLN
1.0000 mg | INTRAMUSCULAR | Status: DC
  Administered 2018-07-05: 1 mg via INTRAVENOUS
  Filled 2018-07-05: qty 2

## 2018-07-05 MED ORDER — DEXAMETHASONE SODIUM PHOSPHATE 10 MG/ML IJ SOLN
INTRAMUSCULAR | Status: AC
  Filled 2018-07-05: qty 1

## 2018-07-05 MED ORDER — METHOCARBAMOL 500 MG IVPB - SIMPLE MED
500.0000 mg | Freq: Four times a day (QID) | INTRAVENOUS | Status: DC | PRN
  Filled 2018-07-05: qty 50

## 2018-07-05 MED ORDER — MAGNESIUM CITRATE PO SOLN: 1.0000 | Freq: Once | ORAL | Status: DC | PRN

## 2018-07-05 MED ORDER — CEFAZOLIN SODIUM-DEXTROSE 2-4 GM/100ML-% IV SOLN
2.0000 g | Freq: Four times a day (QID) | INTRAVENOUS | Status: AC
  Administered 2018-07-05 (×2): 2 g via INTRAVENOUS
  Filled 2018-07-05 (×2): qty 100

## 2018-07-05 MED ORDER — KETOROLAC TROMETHAMINE 30 MG/ML IJ SOLN
INTRAMUSCULAR | Status: AC
  Filled 2018-07-05: qty 1

## 2018-07-05 MED ORDER — CEFAZOLIN SODIUM-DEXTROSE 2-4 GM/100ML-% IV SOLN
2.0000 g | INTRAVENOUS | Status: AC
  Administered 2018-07-05: 2 g via INTRAVENOUS
  Filled 2018-07-05: qty 100

## 2018-07-05 MED ORDER — 0.9 % SODIUM CHLORIDE (POUR BTL) OPTIME
TOPICAL | Status: DC | PRN
  Administered 2018-07-05: 1000 mL

## 2018-07-05 MED ORDER — METHOCARBAMOL 500 MG PO TABS
500.0000 mg | ORAL_TABLET | Freq: Four times a day (QID) | ORAL | Status: DC | PRN
  Administered 2018-07-05 – 2018-07-06 (×2): 500 mg via ORAL
  Filled 2018-07-05 (×2): qty 1

## 2018-07-05 MED ORDER — ZOLPIDEM TARTRATE 5 MG PO TABS: 5.0000 mg | ORAL_TABLET | Freq: Every evening | ORAL | Status: DC | PRN

## 2018-07-05 MED ORDER — GABAPENTIN 300 MG PO CAPS
300.0000 mg | ORAL_CAPSULE | Freq: Once | ORAL | Status: AC
  Administered 2018-07-05: 300 mg via ORAL
  Filled 2018-07-05: qty 1

## 2018-07-05 MED ORDER — PROPOFOL 10 MG/ML IV BOLUS
INTRAVENOUS | Status: AC
  Filled 2018-07-05: qty 60

## 2018-07-05 MED ORDER — OXYCODONE HCL 5 MG/5ML PO SOLN
5.0000 mg | Freq: Once | ORAL | Status: DC | PRN
  Filled 2018-07-05: qty 5

## 2018-07-05 MED ORDER — PROPOFOL 10 MG/ML IV BOLUS
INTRAVENOUS | Status: DC | PRN
  Administered 2018-07-05: 30 mg via INTRAVENOUS

## 2018-07-05 MED ORDER — LACTATED RINGERS IV SOLN
INTRAVENOUS | Status: DC
  Administered 2018-07-05 (×2): via INTRAVENOUS

## 2018-07-05 MED ORDER — BACLOFEN 10 MG PO TABS: 10.0000 mg | ORAL_TABLET | Freq: Three times a day (TID) | ORAL | 0 refills | Status: DC

## 2018-07-05 MED ORDER — ACETAMINOPHEN 325 MG PO TABS: 325.0000 mg | ORAL_TABLET | Freq: Four times a day (QID) | ORAL | Status: DC | PRN

## 2018-07-05 MED ORDER — HYDROMORPHONE HCL 1 MG/ML IJ SOLN: 0.2500 mg | INTRAMUSCULAR | Status: DC | PRN

## 2018-07-05 MED ORDER — DOCUSATE SODIUM 100 MG PO CAPS
100.0000 mg | ORAL_CAPSULE | Freq: Two times a day (BID) | ORAL | Status: DC
  Administered 2018-07-05 – 2018-07-06 (×3): 100 mg via ORAL
  Filled 2018-07-05 (×2): qty 1

## 2018-07-05 SURGICAL SUPPLY — 71 items
BAG SPEC THK2 15X12 ZIP CLS (MISCELLANEOUS) ×1
BAG ZIPLOCK 12X15 (MISCELLANEOUS) ×2 IMPLANT
BANDAGE ESMARK 6X9 LF (GAUZE/BANDAGES/DRESSINGS) ×1 IMPLANT
BEARING TIB MENISCAL RT XS 5MM IMPLANT
BNDG CMPR 9X6 STRL LF SNTH (GAUZE/BANDAGES/DRESSINGS) ×1
BNDG CMPR MED 10X6 ELC LF (GAUZE/BANDAGES/DRESSINGS) ×1
BNDG ELASTIC 6X10 VLCR STRL LF (GAUZE/BANDAGES/DRESSINGS) ×2 IMPLANT
BNDG ESMARK 6X9 LF (GAUZE/BANDAGES/DRESSINGS) ×2
BOWL SMART MIX CTS (DISPOSABLE) ×2 IMPLANT
BRNG TIB XS 5 BYNT PNT RT MEN ×1 IMPLANT
CEMENT BONE R 1X40 (Cement) ×2 IMPLANT
CLSR STERI-STRIP ANTIMIC 1/2X4 (GAUZE/BANDAGES/DRESSINGS) ×1 IMPLANT
COVER SURGICAL LIGHT HANDLE (MISCELLANEOUS) ×2 IMPLANT
COVER WAND RF STERILE (DRAPES) ×1 IMPLANT
CUFF TOURN SGL QUICK 34 (TOURNIQUET CUFF) ×2
CUFF TRNQT CYL 34X4X40X1 (TOURNIQUET CUFF) ×1 IMPLANT
DECANTER SPIKE VIAL GLASS SM (MISCELLANEOUS) ×1 IMPLANT
DRAPE EXTREMITY T 121X128X90 (DRAPE) ×2 IMPLANT
DRAPE POUCH INSTRU U-SHP 10X18 (DRAPES) ×2 IMPLANT
DRAPE U-SHAPE 47X51 STRL (DRAPES) ×2 IMPLANT
DRSG MEPILEX BORDER 4X8 (GAUZE/BANDAGES/DRESSINGS) ×2 IMPLANT
DURAPREP 26ML APPLICATOR (WOUND CARE) ×4 IMPLANT
ELECT REM PT RETURN 15FT ADLT (MISCELLANEOUS) ×2 IMPLANT
FACESHIELD WRAPAROUND (MASK) IMPLANT
GLOVE BIOGEL PI IND STRL 7.0 (GLOVE) IMPLANT
GLOVE BIOGEL PI IND STRL 7.5 (GLOVE) IMPLANT
GLOVE BIOGEL PI IND STRL 8 (GLOVE) ×2 IMPLANT
GLOVE BIOGEL PI INDICATOR 7.0 (GLOVE) ×4
GLOVE BIOGEL PI INDICATOR 7.5 (GLOVE) ×1
GLOVE BIOGEL PI INDICATOR 8 (GLOVE) ×2
GLOVE ORTHO TXT STRL SZ7.5 (GLOVE) ×1 IMPLANT
GLOVE SURG ORTHO 8.0 STRL STRW (GLOVE) ×1 IMPLANT
GLOVE SURG SS PI 7.5 STRL IVOR (GLOVE) ×1 IMPLANT
GLOVE SURG SS PI 8.0 STRL IVOR (GLOVE) ×1 IMPLANT
GOWN SRG XL XLNG 56XLVL 4 (GOWN DISPOSABLE) IMPLANT
GOWN STRL NON-REIN XL XLG LVL4 (GOWN DISPOSABLE) ×2
GOWN STRL REUS W/TWL 2XL LVL3 (GOWN DISPOSABLE) ×2 IMPLANT
GOWN STRL REUS W/TWL LRG LVL3 (GOWN DISPOSABLE) ×3 IMPLANT
HANDPIECE INTERPULSE COAX TIP (DISPOSABLE) ×2
HOLDER FOLEY CATH W/STRAP (MISCELLANEOUS) ×1 IMPLANT
HOOD PEEL AWAY FLYTE STAYCOOL (MISCELLANEOUS) ×4 IMPLANT
IMMOBILIZER KNEE 20 (SOFTGOODS) ×2
IMMOBILIZER KNEE 20 THIGH 36 (SOFTGOODS) IMPLANT
IMMOBILIZER KNEE 22 (SOFTGOODS) IMPLANT
IMMOBILIZER KNEE 22 UNIV (SOFTGOODS) ×2 IMPLANT
KIT BASIN OR (CUSTOM PROCEDURE TRAY) ×2 IMPLANT
KNEE PARTIAL CEMENT FEM XS (Miscellaneous) ×1 IMPLANT
NDL SAFETY ECLIPSE 18X1.5 (NEEDLE) ×2 IMPLANT
NEEDLE HYPO 18GX1.5 SHARP (NEEDLE) ×4
NS IRRIG 1000ML POUR BTL (IV SOLUTION) ×2 IMPLANT
PACK BLADE SAW RECIP 70 3 PT (BLADE) ×1 IMPLANT
PACK ICE MAXI GEL EZY WRAP (MISCELLANEOUS) ×2 IMPLANT
PACK TOTAL JOINT (CUSTOM PROCEDURE TRAY) ×2 IMPLANT
PROTECTOR NERVE ULNAR (MISCELLANEOUS) ×2 IMPLANT
SET HNDPC FAN SPRY TIP SCT (DISPOSABLE) ×1 IMPLANT
STRIP CLOSURE SKIN 1/2X4 (GAUZE/BANDAGES/DRESSINGS) ×1 IMPLANT
SUCTION FRAZIER HANDLE 12FR (TUBING) ×1
SUCTION TUBE FRAZIER 12FR DISP (TUBING) ×1 IMPLANT
SUT VIC AB 0 CT1 36 (SUTURE) ×2 IMPLANT
SUT VIC AB 2-0 CT1 27 (SUTURE) ×2
SUT VIC AB 2-0 CT1 TAPERPNT 27 (SUTURE) ×1 IMPLANT
SUT VIC AB 3-0 SH 8-18 (SUTURE) ×2 IMPLANT
SYR 20CC LL (SYRINGE) ×2 IMPLANT
SYR 3ML LL SCALE MARK (SYRINGE) ×2 IMPLANT
TOWEL OR 17X26 10 PK STRL BLUE (TOWEL DISPOSABLE) ×2 IMPLANT
TOWEL OR NON WOVEN STRL DISP B (DISPOSABLE) ×2 IMPLANT
TRAY FOLEY MTR SLVR 16FR STAT (SET/KITS/TRAYS/PACK) ×1 IMPLANT
TRAY TIBIA MEDIAL OXFORD SZ A (Joint) ×1 IMPLANT
UNICOMP OXFORD MENIXSCAL EX SM ×2 IMPLANT
WATER STERILE IRR 1000ML POUR (IV SOLUTION) ×2 IMPLANT
WRAP KNEE MAXI GEL POST OP (GAUZE/BANDAGES/DRESSINGS) ×1 IMPLANT

## 2018-07-05 NOTE — Evaluation (Signed)
Physical Therapy Evaluation Patient Details Name: Tara Collins MRN: [id number] DOB: 06/16/1935 Today's Date: 07/05/2018   History of Present Illness  R UKA  Clinical Impression  Pt admitted with above diagnosis. Pt currently with functional limitations due to the deficits listed below (see PT Problem List). Pt ambulated 79' with RW, good progress expected. Pt will benefit from skilled PT to increase their independence and safety with mobility to allow discharge to the venue listed below.       Follow Up Recommendations Follow surgeon's recommendation for DC plan and follow-up therapies;Supervision for mobility/OOB    Equipment Recommendations  Rolling walker with 5" wheels    Recommendations for Other Services       Precautions / Restrictions Precautions Precautions: Knee Restrictions Weight Bearing Restrictions: No Other Position/Activity Restrictions: WBAT      Mobility  Bed Mobility Overal bed mobility: Modified Independent             General bed mobility comments: HOB up  Transfers Overall transfer level: Needs assistance Equipment used: Rolling walker (2 wheeled) Transfers: Sit to/from Stand Sit to Stand: Min guard         General transfer comment: VCs hand placement  Ambulation/Gait Ambulation/Gait assistance: Min guard Gait Distance (Feet): 80 Feet Assistive device: Rolling walker (2 wheeled) Gait Pattern/deviations: Step-to pattern Gait velocity: decr   General Gait Details: VCs sequencing  Stairs            Wheelchair Mobility    Modified Rankin (Stroke Patients Only)       Balance Overall balance assessment: Modified Independent                                           Pertinent Vitals/Pain Pain Assessment: 0-10 Pain Score: 1  Pain Location: R TKA Pain Descriptors / Indicators: Sore Pain Intervention(s): Limited activity within patient's tolerance;Monitored during session;Ice applied    Home Living  Family/patient expects to be discharged to:: Private residence Living Arrangements: Alone Available Help at Discharge: Neighbor   Home Access: Level entry     Home Layout: One level Home Equipment: Cane - single point;Walker - 4 wheels;Bedside commode      Prior Function Level of Independence: Independent with assistive device(s)         Comments: used cane PTA     Hand Dominance        Extremity/Trunk Assessment   Upper Extremity Assessment Upper Extremity Assessment: Overall WFL for tasks assessed    Lower Extremity Assessment Lower Extremity Assessment: RLE deficits/detail RLE Deficits / Details: SLR 3/5, knee 0-70* aAROM RLE Sensation: WNL       Communication   Communication: No difficulties  Cognition Arousal/Alertness: Awake/alert Behavior During Therapy: WFL for tasks assessed/performed Overall Cognitive Status: Within Functional Limits for tasks assessed                                        General Comments      Exercises Total Joint Exercises Ankle Circles/Pumps: AROM;Both;10 reps;Supine Quad Sets: AROM;Right;5 reps;Supine Long Arc Quad: AROM;Right;10 reps;Seated Goniometric ROM: 5-70* AAROM   Assessment/Plan    PT Assessment Patient needs continued PT services  PT Problem List Decreased strength;Decreased range of motion;Decreased activity tolerance;Decreased mobility;Decreased balance;Pain       PT Treatment Interventions Gait  training;Therapeutic activities;Therapeutic exercise;Functional mobility training;DME instruction;Patient/family education    PT Goals (Current goals can be found in the Care Plan section)  Acute Rehab PT Goals Patient Stated Goal: to walk better PT Goal Formulation: With patient Time For Goal Achievement: 07/12/18    Frequency 7X/week   Barriers to discharge        Co-evaluation               AM-PAC PT "6 Clicks" Mobility  Outcome Measure Help needed turning from your back to  your side while in a flat bed without using bedrails?: A Little Help needed moving from lying on your back to sitting on the side of a flat bed without using bedrails?: A Little Help needed moving to and from a bed to a chair (including a wheelchair)?: A Little Help needed standing up from a chair using your arms (e.g., wheelchair or bedside chair)?: A Little Help needed to walk in hospital room?: A Little Help needed climbing 3-5 steps with a railing? : A Lot 6 Click Score: 17    End of Session Equipment Utilized During Treatment: Gait belt Activity Tolerance: Patient tolerated treatment well Patient left: in chair;with call bell/phone within reach;with family/visitor present Nurse Communication: Mobility status PT Visit Diagnosis: Pain;Difficulty in walking, not elsewhere classified (R26.2) Pain - Right/Left: Right Pain - part of body: Knee    Time: [id number] PT Time Calculation (min) (ACUTE ONLY): 21 min   Charges:   PT Evaluation $PT Eval Low Complexity: 1 Low          Tamala SerUhlenberg, Taiven Greenley Kistler PT 07/05/2018  Acute Rehabilitation Services Pager [phone] Office [phone]

## 2018-07-05 NOTE — Progress Notes (Signed)
AssistedDr. Miller with right, ultrasound guided, adductor canal block. Side rails up, monitors on throughout procedure. See vital signs in flow sheet. Tolerated Procedure well.  

## 2018-07-05 NOTE — Anesthesia Postprocedure Evaluation (Signed)
Anesthesia Post Note  Patient: Tara Collins  Procedure(s) Performed: UNICOMPARTMENTAL KNEE (Right Knee)     Patient location during evaluation: PACU Anesthesia Type: Spinal Level of consciousness: oriented and awake and alert Pain management: pain level controlled Vital Signs Assessment: post-procedure vital signs reviewed and stable Respiratory status: spontaneous breathing and respiratory function stable Cardiovascular status: blood pressure returned to baseline and stable Postop Assessment: no headache, no backache and no apparent nausea or vomiting Anesthetic complications: no    Last Vitals:  Vitals:   07/05/18 1415 07/05/18 1430  BP: 133/71 131/69  Pulse: 70 68  Resp: 16   Temp:    SpO2: 95% 94%    Last Pain:  Vitals:   07/05/18 1400  TempSrc:   PainSc: 0-No pain                 Lowella CurbWarren Ray Jossalyn Forgione

## 2018-07-05 NOTE — Op Note (Signed)
07/05/2018  12:05 PM  PATIENT:  Tara Collins    PRE-OPERATIVE DIAGNOSIS: Right anteromedial knee osteoarthritis  POST-OPERATIVE DIAGNOSIS:  Same  PROCEDURE:  Unicompartmental Knee Arthroplasty  SURGEON:  Eulas Post, MD  PHYSICIAN ASSISTANT: Janace Litten, OPA-C, present and scrubbed throughout the case, critical for completion in a timely fashion, and for retraction, instrumentation, and closure.  ANESTHESIA:   Spinal  ESTIMATED BLOOD LOSS: 100 mL  UNIQUE ASPECTS OF THE CASE: She was between a size B and a size a on the tibia, but the size B was overhung from front to back.  Additionally, she was a little bit tighter in extension than she was in flexion, she was between a size 5 and a size 6, but the 5 felt congruent and with good tension and good stability.  The intramedullary rod went in easily by hand, bone quality was somewhat poor, I felt like I was contained within the canal, although the component felt a little bit in extension, but seemed to have the appropriate posterior cut.  The lateral compartment was nearly pristine, the ACL was intact, the undersurface of the patella had some chondral changes on the medial facet but the lateral side was very well preserved.  The femoral trochlea had some diffuse chondral loss, grade 2, may be grade 3 at the worst, although her real disease was medial with extensive osteophyte formation, eburnation, and exposed bone.  Given her challenges with recovery from a previous contralateral total knee replacement, I felt she was a good candidate for partial knee.  PREOPERATIVE INDICATIONS:  Tara Collins is a  83 y.o. female with a diagnosis of djd right knee who failed conservative measures and elected for surgical management.    The risks benefits and alternatives were discussed with the patient preoperatively including but not limited to the risks of infection, bleeding, nerve injury, cardiopulmonary complications, blood clots, the need for  revision surgery, among others, and the patient was willing to proceed.  OPERATIVE IMPLANTS: Biomet Oxford mobile bearing medial compartment arthroplasty femur size extra small, tibia size A, bearing size 5.  OPERATIVE FINDINGS: Endstage grade 4 medial compartment osteoarthritis. No significant changes in the lateral compartment, ACL was intact, patellofemoral joint had some grade 2, may be some grade 3 changes although the lateral cartilage on the patella was still preserved.  OPERATIVE PROCEDURE: The patient was brought to the operating room placed in the supine position.  Spinal anesthesia was administered. IV antibiotics were given. The lower extremity was placed in the legholder and prepped and draped in usual sterile fashion.  Time out was performed.  The leg was elevated and exsanguinated and the tourniquet was inflated. Anteromedial incision was performed, and I took care to preserve the MCL. Parapatellar incision was carried out, and the osteophytes were excised, along with the medial meniscus and a small portion of the fat pad.  The extra medullary tibial cutting jig was applied, using the spoon and the 4mm G-Clamp and the 2 mm shim, and I took care to protect the anterior cruciate ligament insertion and the tibial spine. The medial collateral ligament was also protected, and I resected my proximal tibia, matching the anatomic slope.   The proximal tibial bony cut was removed in one piece, and I turned my attention to the femur.  The intramedullary femoral rod was placed using the drill, and then using the appropriate reference, I assembled the femoral jig, setting my posterior cutting block. I resected my posterior femur, used the  0 spigot for the anterior femur, and then measured my gap.   I then used the appropriate mill to match the extension gap to the flexion gap. The second milling was at a 5.  The gaps were then measured again with the appropriate feeler gauges. Once I had  balanced flexion and extension gaps, I then completed the preparation of the femur.  I milled off the anterior aspect of the distal femur to prevent impingement. I also exposed the tibia, and selected the above-named component, and then used the cutting jig to prepare the keel slot on the tibia. I also used the awl to curette out the bone to complete the preparation of the keel. The back wall was intact.  I then placed trial components, and it was found to have excellent motion, and appropriate balance.  I then cemented the components into place, cementing the tibia first, removing all excess cement, and then cementing the femur.  All loose cement was removed.  The real polyethylene insert was applied manually, and the knee was taken through functional range of motion, and found to have excellent stability and restoration of joint motion, with excellent balance.  The wounds were irrigated copiously, and the parapatellar tissue closed with Vicryl, followed by Vicryl for the subcutaneous tissue, with routine closure with Steri-Strips and sterile gauze.  The tourniquet was released, and the patient was awakened and extubated and returned to PACU in stable and satisfactory condition. There were no complications.

## 2018-07-05 NOTE — H&P (Signed)
PREOPERATIVE H&P  Chief Complaint: Right knee pain  HPI: Tara Collins is a 83 y.o. female who presents for preoperative history and physical with a diagnosis of right knee anteromedial osteoarthritis. Symptoms are rated as moderate to severe, and have been worsening.  This is significantly impairing activities of daily living.  She has elected for surgical management.  She had a previous total knee on the other side, with a very challenging recovery, and wanted to have an easier recovery if possible.  She has failed injections, activity modification, anti-inflammatories, and assistive devices.  Preoperative X-rays demonstrate end stage degenerative changes with osteophyte formation, loss of joint space, subchondral sclerosis.   Past Medical History:  Diagnosis Date  . Arthritis   . Hepatitis B 03/1963  . Hypertension   . PONV (postoperative nausea and vomiting)   . Seasonal allergies    Past Surgical History:  Procedure Laterality Date  . ABDOMINAL HYSTERECTOMY  01/1991  . APPENDECTOMY    . CATARACT EXTRACTION, BILATERAL    . CHOLECYSTECTOMY  10/2017  . REPLACEMENT TOTAL KNEE Left 2012   Social History   Socioeconomic History  . Marital status: Single    Spouse name: Not on file  . Number of children: Not on file  . Years of education: Not on file  . Highest education level: Not on file  Occupational History  . Not on file  Social Needs  . Financial resource strain: Not on file  . Food insecurity:    Worry: Not on file    Inability: Not on file  . Transportation needs:    Medical: Not on file    Non-medical: Not on file  Tobacco Use  . Smoking status: Never Smoker  . Smokeless tobacco: Never Used  Substance and Sexual Activity  . Alcohol use: Never    Frequency: Never  . Drug use: Not on file  . Sexual activity: Not on file  Lifestyle  . Physical activity:    Days per week: Not on file    Minutes per session: Not on file  . Stress: Not on file   Relationships  . Social connections:    Talks on phone: Not on file    Gets together: Not on file    Attends religious service: Not on file    Active member of club or organization: Not on file    Attends meetings of clubs or organizations: Not on file    Relationship status: Not on file  Other Topics Concern  . Not on file  Social History Narrative  . Not on file   History reviewed. No pertinent family history. Allergies  Allergen Reactions  . Contrast Media [Iodinated Diagnostic Agents] Shortness Of Breath    ALLERGY ADDED FROM QUESTIONING PT  SHE HAD A PREVIOUS REACTION TO CONTRAST   . Latex Rash    "skin burns"   Prior to Admission medications   Medication Sig Start Date End Date Taking? Authorizing Provider  amLODipine (NORVASC) 10 MG tablet Take 1 tablet (10 mg total) by mouth daily. 06/20/18  Yes [name], FNP  carboxymethylcellulose (REFRESH PLUS) 0.5 % SOLN Place 1 drop into both eyes daily as needed (dry eyes).   Yes [provider]  losartan (COZAAR) 100 MG tablet Take 1 tablet (100 mg total) by mouth daily. 06/20/18  Yes [name], FNP  lovastatin (MEVACOR) 40 MG tablet Take 1 tablet (40 mg total) by mouth daily. 06/20/18  Yes [name], FNP  potassium chloride (MICRO-K) 10  MEQ CR capsule Take 10 mEq by mouth daily. 07/10/17  Yes [provider]  traZODone (DESYREL) 50 MG tablet Take 50 mg by mouth at bedtime.   Yes [provider]     Positive ROS: All other systems have been reviewed and were otherwise negative with the exception of those mentioned in the HPI and as above.  Physical Exam: General: Alert, no acute distress Cardiovascular: No pedal edema Respiratory: No cyanosis, no use of accessory musculature GI: No organomegaly, abdomen is soft and non-tender Skin: No lesions in the area of chief complaint Neurologic: Sensation intact distally Psychiatric: Patient is competent for consent with normal mood and  affect Lymphatic: No axillary or cervical lymphadenopathy  MUSCULOSKELETAL: Right knee has pain located primarily on the medial side.  Positive crepitance.  Varus alignment.  0 to 120 degrees of motion.  Lachman feels intact.  Minimal grind.  Assessment: Right knee osteoarthritis, question involvement of the lateral compartment   Plan: Plan for Procedure(s): UNICOMPARTMENTAL KNEE versus total knee  The risks benefits and alternatives were discussed with the patient including but not limited to the risks of nonoperative treatment, versus surgical intervention including infection, bleeding, nerve injury,  blood clots, cardiopulmonary complications, morbidity, mortality, among others, and they were willing to proceed.   We will assess the lateral compartment during the surgery, if it is indeed a partial knee, then she would be appropriate for admit to observation.   Patient's anticipated LOS is less than 2 midnights, meeting these requirements: - Younger than 3965 - Lives within 1 hour of care - Has a competent adult at home to recover with post-op recover - NO history of  - Chronic pain requiring opiods  - Diabetes  - Coronary Artery Disease  - Heart failure  - Heart attack  - Stroke  - DVT/VTE  - Cardiac arrhythmia  - Respiratory Failure/COPD  - Renal failure  - Anemia  - Advanced Liver disease        Eulas PostJoshua P Salem Mastrogiovanni, MD Cell [phone]   07/05/2018 9:29 AM

## 2018-07-05 NOTE — Anesthesia Procedure Notes (Signed)
Anesthesia Regional Block: Adductor canal block   Pre-Anesthetic Checklist: ,, timeout performed, Correct Patient, Correct Site, Correct Laterality, Correct Procedure, Correct Position, site marked, Risks and benefits discussed,  Surgical consent,  Pre-op evaluation,  At surgeon's request and post-op pain management  Laterality: Right  Prep: chloraprep       Needles:  Injection technique: Single-shot  Needle Type: Stimiplex     Needle Length: 9cm  Needle Gauge: 21     Additional Needles:   Procedures:,,,, ultrasound used (permanent image in chart),,,,  Narrative:  Start time: 07/05/2018 9:46 AM End time: 07/05/2018 9:51 AM Injection made incrementally with aspirations every 5 mL.  Performed by: Personally  Anesthesiologist: Lowella CurbMiller, Warren Ray, MD

## 2018-07-05 NOTE — Anesthesia Preprocedure Evaluation (Signed)
Anesthesia Evaluation  Patient identified by MRN, date of birth, ID band Patient awake    Reviewed: Allergy & Precautions, NPO status , Patient's Chart, lab work & pertinent test results  History of Anesthesia Complications (+) PONV  Airway Mallampati: II  TM Distance: >3 FB Neck ROM: Full    Dental no notable dental hx.    Pulmonary neg pulmonary ROS,    Pulmonary exam normal breath sounds clear to auscultation       Cardiovascular hypertension, Pt. on medications negative cardio ROS Normal cardiovascular exam Rhythm:Regular Rate:Normal     Neuro/Psych  Headaches, Depression negative psych ROS   GI/Hepatic negative GI ROS, Neg liver ROS,   Endo/Other  negative endocrine ROS  Renal/GU negative Renal ROS  negative genitourinary   Musculoskeletal  (+) Arthritis , Osteoarthritis,    Abdominal   Peds negative pediatric ROS (+)  Hematology negative hematology ROS (+)   Anesthesia Other Findings   Reproductive/Obstetrics negative OB ROS                             Anesthesia Physical Anesthesia Plan  ASA: II  Anesthesia Plan: Spinal   Post-op Pain Management:  Regional for Post-op pain   Induction: Intravenous  PONV Risk Score and Plan: 3 and Ondansetron, Dexamethasone, Midazolam and Treatment may vary due to age or medical condition  Airway Management Planned: Simple Face Mask  Additional Equipment:   Intra-op Plan:   Post-operative Plan:   Informed Consent: I have reviewed the patients History and Physical, chart, labs and discussed the procedure including the risks, benefits and alternatives for the proposed anesthesia with the patient or authorized representative who has indicated his/her understanding and acceptance.   Dental advisory given  Plan Discussed with: CRNA  Anesthesia Plan Comments:         Anesthesia Quick Evaluation

## 2018-07-05 NOTE — Anesthesia Procedure Notes (Signed)
Procedure Name: MAC Date/Time: 07/05/2018 10:20 AM Performed by: Lissa Morales, CRNA Pre-anesthesia Checklist: Patient identified, Emergency Drugs available, Suction available, Patient being monitored and Timeout performed Patient Re-evaluated:Patient Re-evaluated prior to induction Oxygen Delivery Method: Simple face mask Placement Confirmation: positive ETCO2

## 2018-07-05 NOTE — Transfer of Care (Signed)
Immediate Anesthesia Transfer of Care Note  Patient: Tara Collins  Procedure(s) Performed: UNICOMPARTMENTAL KNEE (Right Knee)  Patient Location: PACU  Anesthesia Type:Spinal  Level of Consciousness: awake, alert , oriented and patient cooperative  Airway & Oxygen Therapy: Patient Spontanous Breathing and Patient connected to face mask oxygen  Post-op Assessment: Report given to RN and Post -op Vital signs reviewed and stable  Post vital signs: Reviewed and stable  Last Vitals:  Vitals Value Taken Time  BP 116/60 07/05/2018 12:30 PM  Temp    Pulse 66 07/05/2018 12:32 PM  Resp 22 07/05/2018 12:32 PM  SpO2 99 % 07/05/2018 12:32 PM  Vitals shown include unvalidated device data.  Last Pain:  Vitals:   07/05/18 0858  TempSrc:   PainSc: 0-No pain         Complications: No apparent anesthesia complications

## 2018-07-05 NOTE — Discharge Instructions (Signed)

## 2018-07-05 NOTE — Anesthesia Procedure Notes (Signed)
Spinal  Start time: 07/05/2018 10:35 AM End time: 07/05/2018 10:40 AM Staffing Anesthesiologist: Lowella CurbMiller, Ravis Herne Ray, MD Performed: anesthesiologist  Preanesthetic Checklist Completed: patient identified, site marked, surgical consent, pre-op evaluation, timeout performed, IV checked, risks and benefits discussed and monitors and equipment checked Spinal Block Patient position: sitting Prep: ChloraPrep Patient monitoring: heart rate, continuous pulse ox and blood pressure Approach: right paramedian Location: L3-4 Injection technique: single-shot Needle Needle type: Quincke  Needle gauge: 22 G Needle length: 9 cm

## 2018-07-06 ENCOUNTER — Encounter (HOSPITAL_COMMUNITY): Payer: Self-pay | Admitting: Orthopedic Surgery

## 2018-07-06 DIAGNOSIS — M1711 Unilateral primary osteoarthritis, right knee: Secondary | ICD-10-CM | POA: Diagnosis not present

## 2018-07-06 DIAGNOSIS — Z79899 Other long term (current) drug therapy: Secondary | ICD-10-CM | POA: Diagnosis not present

## 2018-07-06 DIAGNOSIS — I10 Essential (primary) hypertension: Secondary | ICD-10-CM | POA: Diagnosis not present

## 2018-07-06 LAB — CBC
HCT: 37.4 % (ref 36.0–46.0)
Hemoglobin: 12.3 g/dL (ref 12.0–15.0)
MCH: 33.6 pg (ref 26.0–34.0)
MCHC: 32.9 g/dL (ref 30.0–36.0)
MCV: 102.2 fL — ABNORMAL HIGH (ref 80.0–100.0)
Platelets: 232 10*3/uL (ref 150–400)
RBC: 3.66 MIL/uL — ABNORMAL LOW (ref 3.87–5.11)
RDW: 11.8 % (ref 11.5–15.5)
WBC: 12.5 10*3/uL — ABNORMAL HIGH (ref 4.0–10.5)
nRBC: 0 % (ref 0.0–0.2)

## 2018-07-06 LAB — BASIC METABOLIC PANEL
Anion gap: 7 (ref 5–15)
BUN: 16 mg/dL (ref 8–23)
CO2: 23 mmol/L (ref 22–32)
Calcium: 8.7 mg/dL — ABNORMAL LOW (ref 8.9–10.3)
Chloride: 110 mmol/L (ref 98–111)
Creatinine, Ser: 0.84 mg/dL (ref 0.44–1.00)
GFR calc Af Amer: >60 mL/min (ref >60)
GFR calc non Af Amer: >60 mL/min (ref >60)
Glucose, Bld: 130 mg/dL — ABNORMAL HIGH (ref 70–99)
Potassium: 4.2 mmol/L (ref 3.5–5.1)
Sodium: 140 mmol/L (ref 135–145)

## 2018-07-06 NOTE — Progress Notes (Signed)
Physical Therapy Treatment Patient Details Name: Tara Collins MRN: [id number] DOB: 03/27/1935 Today's Date: 07/06/2018    History of Present Illness R UKA    PT Comments    Pt progressing well with mobility, she ambulated 160' with RW. Instructed pt in HEP.   Follow Up Recommendations  Follow surgeon's recommendation for DC plan and follow-up therapies;Supervision for mobility/OOB     Equipment Recommendations  Rolling walker with 5" wheels    Recommendations for Other Services       Precautions / Restrictions Precautions Precautions: Knee Precaution Booklet Issued: Yes (comment) Precaution Comments: reviewed no pillow under knee Restrictions Weight Bearing Restrictions: No Other Position/Activity Restrictions: WBAT    Mobility  Bed Mobility Overal bed mobility: Modified Independent             General bed mobility comments: HOB up  Transfers Overall transfer level: Needs assistance Equipment used: Rolling walker (2 wheeled) Transfers: Sit to/from Stand Sit to Stand: Supervision         General transfer comment: VCs hand placement  Ambulation/Gait Ambulation/Gait assistance: Supervision Gait Distance (Feet): 160 Feet Assistive device: Rolling walker (2 wheeled) Gait Pattern/deviations: Step-to pattern;Step-through pattern Gait velocity: decr   General Gait Details: steady, no loss of balance   Stairs             Wheelchair Mobility    Modified Rankin (Stroke Patients Only)       Balance Overall balance assessment: Modified Independent                                          Cognition Arousal/Alertness: Awake/alert Behavior During Therapy: WFL for tasks assessed/performed Overall Cognitive Status: Within Functional Limits for tasks assessed                                        Exercises Total Joint Exercises Ankle Circles/Pumps: AROM;Both;10 reps;Supine Quad Sets: AROM;Right;Supine;10  reps Short Arc Quad: AROM;Right;10 reps;Supine Heel Slides: AAROM;Right;10 reps;Supine Hip ABduction/ADduction: AROM;Right;10 reps;Supine Straight Leg Raises: AROM;Right;10 reps;Supine    General Comments        Pertinent Vitals/Pain Pain Score: 2  Pain Location: R knee Pain Descriptors / Indicators: Sore Pain Intervention(s): Limited activity within patient's tolerance;Monitored during session;Premedicated before session;Ice applied    Home Living                      Prior Function            PT Goals (current goals can now be found in the care plan section) Acute Rehab PT Goals Patient Stated Goal: to walk better PT Goal Formulation: With patient Time For Goal Achievement: 07/12/18 Progress towards PT goals: Progressing toward goals    Frequency    7X/week      PT Plan Current plan remains appropriate    Co-evaluation              AM-PAC PT "6 Clicks" Mobility   Outcome Measure  Help needed turning from your back to your side while in a flat bed without using bedrails?: None Help needed moving from lying on your back to sitting on the side of a flat bed without using bedrails?: A Little Help needed moving to and from a bed to a chair (including a wheelchair)?:  None Help needed standing up from a chair using your arms (e.g., wheelchair or bedside chair)?: None Help needed to walk in hospital room?: None Help needed climbing 3-5 steps with a railing? : A Little 6 Click Score: 22    End of Session Equipment Utilized During Treatment: Gait belt Activity Tolerance: Patient tolerated treatment well Patient left: in chair;with call bell/phone within reach Nurse Communication: Mobility status PT Visit Diagnosis: Pain;Difficulty in walking, not elsewhere classified (R26.2) Pain - Right/Left: Right Pain - part of body: Knee     Time: [id number] PT Time Calculation (min) (ACUTE ONLY): 23 min  Charges:  $Gait Training: 8-22 mins $Therapeutic  Exercise: 8-22 mins                     Ralene BatheUhlenberg, Antwan Bribiesca Kistler PT 07/06/2018  Acute Rehabilitation Services Pager [phone] Office [phone]

## 2018-07-06 NOTE — Care Management Obs Status (Signed)
MEDICARE OBSERVATION STATUS NOTIFICATION   Patient Details  Name: Tara AsaLucy L Capote MRN: [id number] Date of Birth: 09/16/1934   Medicare Observation Status Notification Given:  Yes    Alexis Goodelleele, Barb Shear K, RN 07/06/2018, 9:19 AM

## 2018-07-06 NOTE — Progress Notes (Signed)
Reviewed w/ patient d/c instructions. All questions answered. Verbalized understanding. Will d/c with belongings, instructions, equipment. Awaiting dtr to arrive.

## 2018-07-06 NOTE — Progress Notes (Signed)
Physical Therapy Treatment Patient Details Name: Tara Collins L Mumaw MRN: [id number] DOB: [birth year] Today's Date: 07/06/2018    History of Present Illness R UKA    PT Comments    Pt is progressing well with mobility, she ambulated 180' with RW, no loss of balance. Pt demonstrates good understanding of UKA HEP. She is ready to DC home from PT standpoint.  Follow Up Recommendations  Follow surgeon's recommendation for DC plan and follow-up therapies;Supervision for mobility/OOB     Equipment Recommendations  Rolling walker with 5" wheels    Recommendations for Other Services       Precautions / Restrictions Precautions Precautions: Knee Precaution Booklet Issued: Yes (comment) Precaution Comments: reviewed no pillow under knee Restrictions Weight Bearing Restrictions: No Other Position/Activity Restrictions: WBAT    Mobility  Bed Mobility Overal bed mobility: Modified Independent             General bed mobility comments: up in recliner  Transfers Overall transfer level: Needs assistance Equipment used: Rolling walker (2 wheeled) Transfers: Sit to/from Stand Sit to Stand: Supervision         General transfer comment: VCs hand placement  Ambulation/Gait Ambulation/Gait assistance: Supervision Gait Distance (Feet): 180 Feet Assistive device: Rolling walker (2 wheeled) Gait Pattern/deviations: Step-through pattern Gait velocity: decr   General Gait Details: steady, no loss of balance   Stairs Stairs: (pt has no stairs at home)           Wheelchair Mobility    Modified Rankin (Stroke Patients Only)       Balance Overall balance assessment: Modified Independent                                          Cognition Arousal/Alertness: Awake/alert Behavior During Therapy: WFL for tasks assessed/performed Overall Cognitive Status: Within Functional Limits for tasks assessed                                         Exercises Total Joint Exercises Ankle Circles/Pumps: AROM;Both;10 reps;Supine Quad Sets: AROM;Right;Supine;10 reps Short Arc Quad: AROM;Right;10 reps;Supine Heel Slides: AAROM;Right;10 reps;Supine Hip ABduction/ADduction: AROM;Right;10 reps;Supine Straight Leg Raises: AROM;Right;10 reps;Supine Long Arc Quad: AROM;Right;10 reps;Seated Knee Flexion: AROM;AAROM;Right;10 reps;Seated Goniometric ROM: 5-100* AAROM R knee sitting    General Comments        Pertinent Vitals/Pain Pain Score: 2  Pain Location: R knee Pain Descriptors / Indicators: Sore Pain Intervention(s): Limited activity within patient's tolerance;Premedicated before session;Ice applied    Home Living                      Prior Function            PT Goals (current goals can now be found in the care plan section) Acute Rehab PT Goals Patient Stated Goal: to walk better PT Goal Formulation: With patient Time For Goal Achievement: 07/12/18 Progress towards PT goals: Progressing toward goals    Frequency    7X/week      PT Plan Current plan remains appropriate    Co-evaluation              AM-PAC PT "6 Clicks" Mobility   Outcome Measure  Help needed turning from your back to your side while in a flat bed without using bedrails?: None Help  needed moving from lying on your back to sitting on the side of a flat bed without using bedrails?: A Little Help needed moving to and from a bed to a chair (including a wheelchair)?: None Help needed standing up from a chair using your arms (e.g., wheelchair or bedside chair)?: None Help needed to walk in hospital room?: None Help needed climbing 3-5 steps with a railing? : A Little 6 Click Score: 22    End of Session Equipment Utilized During Treatment: Gait belt Activity Tolerance: Patient tolerated treatment well Patient left: in chair;with call bell/phone within reach Nurse Communication: Mobility status PT Visit Diagnosis: Pain;Difficulty in  walking, not elsewhere classified (R26.2) Pain - Right/Left: Right Pain - part of body: Knee     Time: 1610-9604 PT Time Calculation (min) (ACUTE ONLY): 18 min  Charges:  $Gait Training: 8-22 mins $Therapeutic Exercise: 8-22 mins                     Ralene Bathe Kistler PT 07/06/2018  Acute Rehabilitation Services Pager [phone] Office [phone]

## 2018-07-06 NOTE — Progress Notes (Addendum)
Patient ID: Tara Collins, female   DOB: [birth year], 83 y.o.   MRN: [id number]     Subjective:  Patient reports pain as mild.  Patient denied CP or SOB but does want to go home tomorrow due to lack of support at home until tomorrow  Objective:   VITALS:   Vitals:   07/05/18 1815 07/05/18 2054 07/06/18 0014 07/06/18 0450  BP: (!) 126/59 127/66 128/66 123/76  Pulse: 77 75 70 65  Resp: 18 16 16 15   Temp: 97.7 F (36.5 C) 98 F (36.7 C) 97.9 F (36.6 C) 97.7 F (36.5 C)  TempSrc: Oral Oral Oral Oral  SpO2: 98% 96% 94% 98%  Weight:      Height:        ABD soft Sensation intact distally Dorsiflexion/Plantar flexion intact Incision: dressing C/D/I and no drainage   Lab Results  Component Value Date   WBC 12.5 (H) 07/06/2018   HGB 12.3 07/06/2018   HCT 37.4 07/06/2018   MCV 102.2 (H) 07/06/2018   PLT 232 07/06/2018   BMET    Component Value Date/Time   NA 140 07/06/2018 0329   NA 145 (H) 06/20/2018 0859   K 4.2 07/06/2018 0329   CL 110 07/06/2018 0329   CO2 23 07/06/2018 0329   GLUCOSE 130 (H) 07/06/2018 0329   BUN 16 07/06/2018 0329   BUN 10 06/20/2018 0859   CREATININE 0.84 07/06/2018 0329   CALCIUM 8.7 (L) 07/06/2018 0329   GFRNONAA >60 07/06/2018 0329   GFRAA >60 07/06/2018 0329     Assessment/Plan: 1 Day Post-Op   Principal Problem:   Primary localized osteoarthritis of right knee Active Problems:   Status post right partial knee replacement   Advance diet Up with therapy Dry dressing PRN WBAT    Tara SevereBrandon D Collins 07/06/2018, 8:29 AM  Discussed and agree with above. Will need to work on safe discharge plan, no family support.  Will keep in observation for now, but not sure how to handle dispo planning vs. Admission status.     Tara LucyJoshua Zianna Dercole, MD Cell [phone]

## 2018-07-06 NOTE — Discharge Summary (Signed)
Physician Discharge Summary  Patient ID: Tara Collins MRN: [id number] DOB/AGE: 81-Jul-1936 81 y.o.  Admit date: 07/05/2018 Discharge date: 07/06/2018  Admission Diagnoses:  Primary localized osteoarthritis of right knee  Discharge Diagnoses:  Principal Problem:   Primary localized osteoarthritis of right knee Active Problems:   Status post right partial knee replacement   Past Medical History:  Diagnosis Date  . Arthritis   . Hepatitis B 03/1963  . Hypertension   . PONV (postoperative nausea and vomiting)   . Primary localized osteoarthritis of right knee 07/05/2018  . Seasonal allergies     Surgeries: Procedure(s): UNICOMPARTMENTAL KNEE on 07/05/2018   Consultants (if any):   Discharged Condition: Improved  Hospital Course: Tara Collins is an 83 y.o. female who was admitted 07/05/2018 with a diagnosis of Primary localized osteoarthritis of right knee and went to the operating room on 07/05/2018 and underwent the above named procedures.    She was given perioperative antibiotics:  Anti-infectives (From admission, onward)   Start     Dose/Rate Route Frequency Ordered Stop   07/05/18 1630  ceFAZolin (ANCEF) IVPB 2g/100 mL premix     2 g 200 mL/hr over 30 Minutes Intravenous Every 6 hours 07/05/18 1450 07/05/18 2239   07/05/18 0845  ceFAZolin (ANCEF) IVPB 2g/100 mL premix     2 g 200 mL/hr over 30 Minutes Intravenous On call to O.R. 07/05/18 [id number] 07/05/18 1037    .  She was given sequential compression devices, early ambulation, and asipirin for DVT prophylaxis.  She benefited maximally from the hospital stay and there were no complications.    Recent vital signs:  Vitals:   07/06/18 0450 07/06/18 1012  BP: 123/76 137/66  Pulse: 65 64  Resp: 15 16  Temp: 97.7 F (36.5 C) 98.5 F (36.9 C)  SpO2: 98% 97%    Recent laboratory studies:  Lab Results  Component Value Date   HGB 12.3 07/06/2018   HGB 14.1 06/28/2018   HGB 13.8 09/19/2017   Lab Results   Component Value Date   WBC 12.5 (H) 07/06/2018   PLT 232 07/06/2018   Lab Results  Component Value Date   INR 1.08 12/25/2010   Lab Results  Component Value Date   NA 140 07/06/2018   K 4.2 07/06/2018   CL 110 07/06/2018   CO2 23 07/06/2018   BUN 16 07/06/2018   CREATININE 0.84 07/06/2018   GLUCOSE 130 (H) 07/06/2018    Discharge Medications:   Allergies as of 07/06/2018      Reactions   Contrast Media [iodinated Diagnostic Agents] Shortness Of Breath   ALLERGY ADDED FROM QUESTIONING PT  SHE HAD A PREVIOUS REACTION TO CONTRAST    Latex Rash   "skin burns"      Medication List    TAKE these medications   amLODipine 10 MG tablet Commonly known as:  NORVASC Take 1 tablet (10 mg total) by mouth daily.   aspirin EC 325 MG tablet Take 1 tablet (325 mg total) by mouth 2 (two) times daily.   baclofen 10 MG tablet Commonly known as:  LIORESAL Take 1 tablet (10 mg total) by mouth 3 (three) times daily. As needed for muscle spasm   carboxymethylcellulose 0.5 % Soln Commonly known as:  REFRESH PLUS Place 1 drop into both eyes daily as needed (dry eyes).   HYDROcodone-acetaminophen 10-325 MG tablet Commonly known as:  NORCO Take 1 tablet by mouth every 6 (six) hours as needed.   losartan 100 MG  tablet Commonly known as:  COZAAR Take 1 tablet (100 mg total) by mouth daily.   lovastatin 40 MG tablet Commonly known as:  MEVACOR Take 1 tablet (40 mg total) by mouth daily.   ondansetron 4 MG tablet Commonly known as:  ZOFRAN Take 1 tablet (4 mg total) by mouth every 8 (eight) hours as needed for nausea or vomiting.   potassium chloride 10 MEQ CR capsule Commonly known as:  MICRO-K Take 10 mEq by mouth daily.   sennosides-docusate sodium 8.6-50 MG tablet Commonly known as:  SENOKOT-S Take 2 tablets by mouth daily.   traZODone 50 MG tablet Commonly known as:  DESYREL Take 50 mg by mouth at bedtime.       Diagnostic Studies: Dg Knee Right Port  Result Date:  07/05/2018 CLINICAL DATA:  Post partial right knee replacement EXAM: PORTABLE RIGHT KNEE - 1-2 VIEW COMPARISON:  None. FINDINGS: Postoperative views of the right knee done portably show unicompartmental right knee replacement involving the medial compartment. There is only mild degenerative change within the lateral and patellofemoral compartments. Some air is noted in the soft tissues and joint space postoperatively. No complicating features are seen. IMPRESSION: Unicompartmental knee replacement bulging the medial compartment. No complicating features. Electronically Signed   By: Dwyane Dee M.D.   On: 07/05/2018 13:33    Disposition:     Follow-up Information    Teryl Terrisa, MD. Schedule an appointment as soon as possible for a visit in 2 weeks.   Specialty:  Orthopedic Surgery Contact information: [address] [place] [address] [phone]        [facility]   Specialty:  Home Health Services Why:  physical therapy Contact information: [address] [place] [address] [phone]            Signed: Eulas Post 07/06/2018, 12:19 PM

## 2018-07-06 NOTE — Care Management Note (Signed)
Case Management Note  Patient Details  Name: Tara Collins MRN: [id number] Date of Birth: [birth year]  Subjective/Objective:    Discharge planning, spoke with patient at bedside. [facility].   Action/Plan: Contacted [facility] for referral. They have accepted.  Needs RW, contacted [employer] to deliver to room. [phone]                Expected Discharge Date:                  Expected Discharge Plan:  Home w Home Health Services  In-House Referral:  NA  Discharge planning Services  CM Consult  Post Acute Care Choice:  Durable Medical Equipment, Home Health Choice offered to:  Patient  DME Arranged:  Walker rolling DME Agency:  [employer]  HH Arranged:  PT HH [facility]  [facility]  Status of Service:  Completed, signed off  If discussed at [employer] Meetings, dates discussed:    Additional Comments:  Alexis Goodelleele, Zylpha Poynor K, RN 07/06/2018, 9:32 AM

## 2018-07-08 DIAGNOSIS — Z471 Aftercare following joint replacement surgery: Secondary | ICD-10-CM | POA: Diagnosis not present

## 2018-07-08 DIAGNOSIS — B1910 Unspecified viral hepatitis B without hepatic coma: Secondary | ICD-10-CM | POA: Diagnosis not present

## 2018-07-08 DIAGNOSIS — F419 Anxiety disorder, unspecified: Secondary | ICD-10-CM | POA: Diagnosis not present

## 2018-07-08 DIAGNOSIS — I10 Essential (primary) hypertension: Secondary | ICD-10-CM | POA: Diagnosis not present

## 2018-07-08 DIAGNOSIS — Z96653 Presence of artificial knee joint, bilateral: Secondary | ICD-10-CM | POA: Diagnosis not present

## 2018-07-08 DIAGNOSIS — E785 Hyperlipidemia, unspecified: Secondary | ICD-10-CM | POA: Diagnosis not present

## 2018-07-08 DIAGNOSIS — F329 Major depressive disorder, single episode, unspecified: Secondary | ICD-10-CM | POA: Diagnosis not present

## 2018-07-12 DIAGNOSIS — Z471 Aftercare following joint replacement surgery: Secondary | ICD-10-CM | POA: Diagnosis not present

## 2018-07-12 DIAGNOSIS — Z96653 Presence of artificial knee joint, bilateral: Secondary | ICD-10-CM | POA: Diagnosis not present

## 2018-07-12 DIAGNOSIS — F329 Major depressive disorder, single episode, unspecified: Secondary | ICD-10-CM | POA: Diagnosis not present

## 2018-07-12 DIAGNOSIS — I10 Essential (primary) hypertension: Secondary | ICD-10-CM | POA: Diagnosis not present

## 2018-07-12 DIAGNOSIS — B1910 Unspecified viral hepatitis B without hepatic coma: Secondary | ICD-10-CM | POA: Diagnosis not present

## 2018-07-12 DIAGNOSIS — E785 Hyperlipidemia, unspecified: Secondary | ICD-10-CM | POA: Diagnosis not present

## 2018-07-12 DIAGNOSIS — F419 Anxiety disorder, unspecified: Secondary | ICD-10-CM | POA: Diagnosis not present

## 2018-07-14 DIAGNOSIS — F329 Major depressive disorder, single episode, unspecified: Secondary | ICD-10-CM | POA: Diagnosis not present

## 2018-07-14 DIAGNOSIS — E785 Hyperlipidemia, unspecified: Secondary | ICD-10-CM | POA: Diagnosis not present

## 2018-07-14 DIAGNOSIS — Z96653 Presence of artificial knee joint, bilateral: Secondary | ICD-10-CM | POA: Diagnosis not present

## 2018-07-14 DIAGNOSIS — F419 Anxiety disorder, unspecified: Secondary | ICD-10-CM | POA: Diagnosis not present

## 2018-07-14 DIAGNOSIS — I10 Essential (primary) hypertension: Secondary | ICD-10-CM | POA: Diagnosis not present

## 2018-07-14 DIAGNOSIS — Z471 Aftercare following joint replacement surgery: Secondary | ICD-10-CM | POA: Diagnosis not present

## 2018-07-14 DIAGNOSIS — B1910 Unspecified viral hepatitis B without hepatic coma: Secondary | ICD-10-CM | POA: Diagnosis not present

## 2018-07-15 DIAGNOSIS — B1910 Unspecified viral hepatitis B without hepatic coma: Secondary | ICD-10-CM | POA: Diagnosis not present

## 2018-07-15 DIAGNOSIS — Z471 Aftercare following joint replacement surgery: Secondary | ICD-10-CM | POA: Diagnosis not present

## 2018-07-15 DIAGNOSIS — E785 Hyperlipidemia, unspecified: Secondary | ICD-10-CM | POA: Diagnosis not present

## 2018-07-15 DIAGNOSIS — I10 Essential (primary) hypertension: Secondary | ICD-10-CM | POA: Diagnosis not present

## 2018-07-15 DIAGNOSIS — Z96653 Presence of artificial knee joint, bilateral: Secondary | ICD-10-CM | POA: Diagnosis not present

## 2018-07-15 DIAGNOSIS — F329 Major depressive disorder, single episode, unspecified: Secondary | ICD-10-CM | POA: Diagnosis not present

## 2018-07-15 DIAGNOSIS — F419 Anxiety disorder, unspecified: Secondary | ICD-10-CM | POA: Diagnosis not present

## 2018-07-18 DIAGNOSIS — M1711 Unilateral primary osteoarthritis, right knee: Secondary | ICD-10-CM | POA: Diagnosis not present

## 2018-07-19 DIAGNOSIS — F419 Anxiety disorder, unspecified: Secondary | ICD-10-CM | POA: Diagnosis not present

## 2018-07-19 DIAGNOSIS — B1910 Unspecified viral hepatitis B without hepatic coma: Secondary | ICD-10-CM | POA: Diagnosis not present

## 2018-07-19 DIAGNOSIS — Z471 Aftercare following joint replacement surgery: Secondary | ICD-10-CM | POA: Diagnosis not present

## 2018-07-19 DIAGNOSIS — I10 Essential (primary) hypertension: Secondary | ICD-10-CM | POA: Diagnosis not present

## 2018-07-19 DIAGNOSIS — F329 Major depressive disorder, single episode, unspecified: Secondary | ICD-10-CM | POA: Diagnosis not present

## 2018-07-19 DIAGNOSIS — Z96653 Presence of artificial knee joint, bilateral: Secondary | ICD-10-CM | POA: Diagnosis not present

## 2018-07-19 DIAGNOSIS — E785 Hyperlipidemia, unspecified: Secondary | ICD-10-CM | POA: Diagnosis not present

## 2018-07-22 ENCOUNTER — Encounter

## 2018-07-23 ENCOUNTER — Other Ambulatory Visit: Payer: Self-pay | Admitting: Nurse Practitioner

## 2018-07-26 ENCOUNTER — Ambulatory Visit: Payer: Medicare HMO | Attending: Orthopedic Surgery | Admitting: Physical Therapy

## 2018-07-26 ENCOUNTER — Encounter: Payer: Self-pay | Admitting: Physical Therapy

## 2018-07-26 DIAGNOSIS — R600 Localized edema: Secondary | ICD-10-CM | POA: Insufficient documentation

## 2018-07-26 DIAGNOSIS — M6281 Muscle weakness (generalized): Secondary | ICD-10-CM | POA: Diagnosis not present

## 2018-07-26 DIAGNOSIS — R2689 Other abnormalities of gait and mobility: Secondary | ICD-10-CM | POA: Diagnosis not present

## 2018-07-26 DIAGNOSIS — M25561 Pain in right knee: Secondary | ICD-10-CM | POA: Diagnosis not present

## 2018-07-26 DIAGNOSIS — M25661 Stiffness of right knee, not elsewhere classified: Secondary | ICD-10-CM | POA: Diagnosis not present

## 2018-07-26 NOTE — Therapy (Signed)
[facility] [address] [place], [place], [address] Phone: [phone]   Fax:  [phone]  Physical Therapy Evaluation  Patient Details  Name: Tara Collins MRN: [id number] Date of Birth: 1935-01-24 Referring Provider (PT): Dr Dorthula Nettles   Encounter Date: 07/26/2018  PT End of Session - 07/26/18 0804    Visit Number  1    Number of Visits  18    Date for PT Re-Evaluation  09/06/18    PT Start Time  0804    PT Stop Time  0856    PT Time Calculation (min)  52 min    Activity Tolerance  Patient tolerated treatment well       Past Medical History:  Diagnosis Date  . Arthritis   . Hepatitis B 03/1963  . Hypertension   . PONV (postoperative nausea and vomiting)   . Primary localized osteoarthritis of right knee 07/05/2018  . Seasonal allergies     Past Surgical History:  Procedure Laterality Date  . ABDOMINAL HYSTERECTOMY  01/1991  . APPENDECTOMY    . CATARACT EXTRACTION, BILATERAL    . CHOLECYSTECTOMY  10/2017  . PARTIAL KNEE ARTHROPLASTY Right 07/05/2018   Procedure: UNICOMPARTMENTAL KNEE;  Surgeon: Teryl Makaley, MD;  Location: [facility];  Service: Orthopedics;  Laterality: Right;  . REPLACEMENT TOTAL KNEE Left 2012    There were no vitals filed for this visit.   Subjective Assessment - 07/26/18 0805    Subjective  Pt had elective Rt uni knee about 3 wks ago had HHPT for 2 wks.     Patient Stated Goals  get to walk normal - not need a cane    Currently in Pain?  Yes    Pain Score  2     Pain Location  Knee    Pain Orientation  Right    Pain Descriptors / Indicators  Aching;Dull    Pain Type  Surgical pain    Pain Onset  1 to 4 weeks ago    Pain Frequency  Intermittent    Aggravating Factors   not icing    Pain Relieving Factors  ice and meds OTC         [facility] PT Assessment - 07/26/18 0001      Assessment   Medical Diagnosis  Rt uni TKA    Referring Provider (PT)  Dr Dorthula Nettles    Onset  Date/Surgical Date  07/05/18    Hand Dominance  Right    Next MD Visit  08/15/18    Prior Therapy  yes HHPT      Precautions   Precautions  None      Balance Screen   Has the patient fallen in the past 6 months  No      Home Environment   Living Environment  Private residence    Living Arrangements  Alone      Prior Function   Level of Independence  Independent   some difficulty with donning    Vocation  Retired    Leisure  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      ROM / Strength   AROM / PROM / Strength  AROM;PROM;Strength      AROM   AROM Assessment Site  Knee    Right/Left Knee  Left;Right    Right Knee Extension  -10    Right Knee Flexion  70    Left Knee Extension  0    Left Knee Flexion  124      PROM   PROM Assessment Site  Knee    Right/Left Knee  Right    Right Knee Extension  -5    Right Knee Flexion  88      Strength   Strength Assessment Site  Hip;Knee;Ankle    Right/Left Hip  Right    Right Hip Flexion  4+/5    Right/Left Knee  Right    Right Knee Flexion  4-/5    Right Knee Extension  4/5    Right/Left Ankle  --   WNL     Palpation   Patella mobility  hypomobile    Palpation comment  heat in Rt knee and tenderness around the knee joint.  Steristrips present      Bed Mobility   Bed Mobility  --   independent     Transfers   Transfers  Independent with all Transfers      Ambulation/Gait   Ambulation/Gait  Yes    Ambulation Distance (Feet)  --   observed in clinic   Assistive device  Straight cane    Gait Pattern  Step-to pattern;Decreased stance time - right;Decreased hip/knee flexion - right;Right circumduction                Objective measurements completed on examination: See above findings.      [facility] Adult PT Treatment/Exercise -  07/26/18 0001      Exercises   Exercises  Knee/Hip      Knee/Hip Exercises: Aerobic   Stationary Bike  attemtpted for ROM - pt not able to tolerate     Nustep  L1 with U/LE x 5' to work on [employer]      Knee/Hip Exercises: Seated   Long Arc [employer]  Right;10 reps    Heel Slides  AAROM;Right;10 reps      Modalities   Modalities  Vasopneumatic      Vasopneumatic   Number Minutes Vasopneumatic   15 minutes    Vasopnuematic Location   Knee    Vasopneumatic Pressure  Medium    Vasopneumatic Temperature   3*             PT Education - 07/26/18 0831    Education Details  HEP    Person(s) Educated  Patient    Methods  Explanation;Demonstration;Handout    Comprehension  Returned demonstration;Verbalized understanding       PT Short Term Goals - 07/26/18 0835      PT SHORT TERM GOAL #1   Title  I with initial HEP ( 08/16/18)     Time  3    Period  Weeks    Status  New    Target Date  08/16/18      PT SHORT TERM GOAL #2   Title  increase Rt knee motion -3 extension to 100 flexion ( 08/16/18)     Time  3    Period  Weeks    Status  New    Target Date  08/16/18        PT Long Term Goals - 07/26/18 0836      PT LONG TERM GOAL #1   Title  I with advanced HEP ( 09/06/18)     Time  6  Period  Weeks    Status  New    Target Date  09/06/18      PT LONG TERM GOAL #2   Title  increased Rt hip and knee strength =/> 5-/5 to allow gait in apartment without AD ( 09/06/18)     Time  6    Period  Weeks    Status  New    Target Date  09/06/18      PT LONG TERM GOAL #3   Title  improve FOTO =/< 44% limited 9 09/06/18)     Time  6    Period  Weeks    Status  New    Target Date  09/06/18      PT LONG TERM GOAL #4   Title  ambuate up down a flight of stairs with no difficulty using a railing to allow her to walk in her daughters house ( 09/06/18)     Time  6    Period  Weeks    Status  New    Target Date  09/06/18      PT LONG TERM GOAL #5   Title  improve Rt knee ROM to  within 5 degrees flexion of Lt ( 09/06/18)     Time  6    Period  Weeks    Status  New    Target Date  09/06/18             Plan - 07/26/18 0842    Clinical Impression Statement  83 yo female 3 wks s/p Rt uni TKA.  He has limited knee ROM and decreased strength in the Rt LE.  She is having to use a cane for ambulation with gait deviations.  There is some swelling in the medial Rt knee and lateral lower leg.  Would benefit from PT to restore normal movement, strenght and functional abilities.     Clinical Presentation  Stable    Clinical Decision Making  Low    Rehab Potential  Excellent    PT Frequency  3x / week    PT Duration  6 weeks    PT Treatment/Interventions  DME Instruction;Balance training;Scar mobilization;Passive range of motion;Neuromuscular re-education;Stair training;Moist Heat;Ultrasound;Functional mobility training;Manual techniques;Dry needling;Patient/family education;Therapeutic activities;Cryotherapy;Electrical Stimulation;Therapeutic exercise;Vasopneumatic Device    PT Next Visit Plan  progress Rt knee ROM, strength and gai    Consulted and Agree with Plan of Care  Patient       Patient will benefit from skilled therapeutic intervention in order to improve the following deficits and impairments:  Abnormal gait, Pain, Decreased range of motion, Decreased strength, Difficulty walking, Decreased balance  Visit Diagnosis: Stiffness of right knee, not elsewhere classified - Plan: PT plan of care cert/re-cert  Acute pain of right knee - Plan: PT plan of care cert/re-cert  Localized edema - Plan: PT plan of care cert/re-cert  Other abnormalities of gait and mobility - Plan: PT plan of care cert/re-cert  Muscle weakness (generalized) - Plan: PT plan of care cert/re-cert     Problem List Patient Active Problem List   Diagnosis Date Noted  . Primary localized osteoarthritis of right knee 07/05/2018  . Status post right partial knee replacement 07/05/2018  .  Dyslipidemia 06/20/2018  . ARTHRITIS, LEFT KNEE 11/06/2009  . DEPRESSION/ANXIETY 11/29/2008  . POSTMENOPAUSAL STATUS 11/29/2008  . HEADACHE, TENSION 02/03/2008  . LEG CRAMPS 02/03/2008  . MICROALBUMINURIA 07/27/2007  . HYPOKALEMIA 07/26/2007  . ACUTE CYSTITIS 07/26/2007  . DYSLIPIDEMIA 04/29/2007  . Essential hypertension  04/29/2007  . POSTPROCEDURAL STATE, CATARACT EXTRACTION 04/29/2007    Rose FillersSusan Shave rPT  07/26/2018, 9:40 AM  [facility] [address] [place], [place], [address] Phone: [phone]   Fax:  [phone]  Name: Fabio AsaLucy L Mayeda MRN: [id number] Date of Birth: 1934-11-26

## 2018-07-28 ENCOUNTER — Other Ambulatory Visit: Payer: Self-pay | Admitting: Nurse Practitioner

## 2018-07-28 MED ORDER — TRAZODONE HCL 50 MG PO TABS: 50.0000 mg | ORAL_TABLET | Freq: Every day | ORAL | 2 refills | Status: DC

## 2018-08-01 ENCOUNTER — Encounter: Payer: Self-pay | Admitting: Physical Therapy

## 2018-08-01 ENCOUNTER — Ambulatory Visit: Payer: Medicare HMO | Attending: Orthopedic Surgery | Admitting: Physical Therapy

## 2018-08-01 DIAGNOSIS — R600 Localized edema: Secondary | ICD-10-CM | POA: Diagnosis not present

## 2018-08-01 DIAGNOSIS — M25561 Pain in right knee: Secondary | ICD-10-CM

## 2018-08-01 DIAGNOSIS — R2689 Other abnormalities of gait and mobility: Secondary | ICD-10-CM | POA: Insufficient documentation

## 2018-08-01 DIAGNOSIS — M25661 Stiffness of right knee, not elsewhere classified: Secondary | ICD-10-CM | POA: Diagnosis not present

## 2018-08-01 DIAGNOSIS — M6281 Muscle weakness (generalized): Secondary | ICD-10-CM | POA: Diagnosis not present

## 2018-08-01 NOTE — Therapy (Signed)
[facility] [address] [place], [place], [address] Phone: [phone]   Fax:  [phone]  Physical Therapy Treatment  Patient Details  Name: Tara Collins MRN: [id number] Date of Birth: [birth year] Referring Provider (PT): Dr Dorthula Nettles   Encounter Date: 08/01/2018  PT End of Session - 08/01/18 0805    Visit Number  2    Number of Visits  18    Date for PT Re-Evaluation  09/06/18    PT Start Time  0725    PT Stop Time  0811    PT Time Calculation (min)  46 min    Activity Tolerance  Patient tolerated treatment well    Behavior During Therapy  [facility] for tasks assessed/performed       Past Medical History:  Diagnosis Date  . Arthritis   . Hepatitis B 03/1963  . Hypertension   . PONV (postoperative nausea and vomiting)   . Primary localized osteoarthritis of right knee 07/05/2018  . Seasonal allergies     Past Surgical History:  Procedure Laterality Date  . ABDOMINAL HYSTERECTOMY  01/1991  . APPENDECTOMY    . CATARACT EXTRACTION, BILATERAL    . CHOLECYSTECTOMY  10/2017  . PARTIAL KNEE ARTHROPLASTY Right 07/05/2018   Procedure: UNICOMPARTMENTAL KNEE;  Surgeon: Teryl Montasia, MD;  Location: [facility];  Service: Orthopedics;  Laterality: Right;  . REPLACEMENT TOTAL KNEE Left 2012    There were no vitals filed for this visit.  Subjective Assessment - 08/01/18 0724    Subjective  Exercised 10 minutes yesterday.       Currently in Pain?  Yes    Pain Score  3     Pain Location  Knee    Pain Orientation  Right    Pain Descriptors / Indicators  Aching;Dull    Pain Type  Surgical pain    Pain Frequency  Intermittent    Aggravating Factors   some exercise    Pain Relieving Factors  ice  meds OTC    Effect of Pain on Daily Activities  needs cane   Limits activities.     Multiple Pain Sites  No                       [facility] Adult PT Treatment/Exercise - 08/01/18 0001      Knee/Hip Exercises: Stretches    Gastroc Stretch  3 reps;30 seconds    Gastroc Stretch Limitations  cued ,  incline      Knee/Hip Exercises: Standing   Heel Raises  Both;1 set;10 reps    Gait Training  cane use trunc rotation weight shift. cued improving    Other Standing Knee Exercises  wall slides facing wall.  min cues right and left,  close SBA      Knee/Hip Exercises: Seated   Long Arc Quad  Right;10 reps    Long Arc Quad Limitations  cued foot position    Heel Slides  10 reps    Heel Slides Limitations  cued for toe taps      Knee/Hip Exercises: Supine   Quad Sets  10 reps    Heel Slides  AAROM;10 reps    Heel Slides Limitations  strap,  cues    Straight Leg Raises  AROM;10 reps    Straight Leg Raises Limitations  lift from bolster cued foot position,  quad set      Cryotherapy   Number Minutes Cryotherapy  10 Minutes  Cryotherapy Location  Knee    Type of Cryotherapy  --   cold pack     Manual Therapy   Manual Therapy  Joint mobilization;Soft tissue mobilization    Joint Mobilization  P/a glides with move to increase flexionment     Soft tissue mobilization  right thigh  retrograde,  patellar mobs               PT Short Term Goals - 07/26/18 0835      PT SHORT TERM GOAL #1   Title  I with initial HEP ( 08/16/18)     Time  3    Period  Weeks    Status  New    Target Date  08/16/18      PT SHORT TERM GOAL #2   Title  increase Rt knee motion -3 extension to 100 flexion ( 08/16/18)     Time  3    Period  Weeks    Status  New    Target Date  08/16/18        PT Long Term Goals - 07/26/18 0836      PT LONG TERM GOAL #1   Title  I with advanced HEP ( 09/06/18)     Time  6    Period  Weeks    Status  New    Target Date  09/06/18      PT LONG TERM GOAL #2   Title  increased Rt hip and knee strength =/> 5-/5 to allow gait in apartment without AD ( 09/06/18)     Time  6    Period  Weeks    Status  New    Target Date  09/06/18      PT LONG TERM GOAL #3   Title  improve FOTO =/<  44% limited 9 09/06/18)     Time  6    Period  Weeks    Status  New    Target Date  09/06/18      PT LONG TERM GOAL #4   Title  ambuate up down a flight of stairs with no difficulty using a railing to allow her to walk in her daughters house ( 09/06/18)     Time  6    Period  Weeks    Status  New    Target Date  09/06/18      PT LONG TERM GOAL #5   Title  improve Rt knee ROM to within 5 degrees flexion of Lt ( 09/06/18)     Time  6    Period  Weeks    Status  New    Target Date  09/06/18            Plan - 08/01/18 0749    Clinical Impression Statement  AAknee flexion 95.  Mild pain with session.  Pain 5/10 at end of sesion.  Patient did not like the vasopneumatic so used cold pack.     PT Next Visit Plan  progress Rt knee ROM, strength and gai    PT Home Exercise Plan  [facility].  Gait with cane.    Consulted and Agree with Plan of Care  Patient       Patient will benefit from skilled therapeutic intervention in order to improve the following deficits and impairments:     Visit Diagnosis: Stiffness of right knee, not elsewhere classified  Acute pain of right knee  Localized edema  Other abnormalities of gait and  mobility  Muscle weakness (generalized)     Problem List Patient Active Problem List   Diagnosis Date Noted  . Primary localized osteoarthritis of right knee 07/05/2018  . Status post right partial knee replacement 07/05/2018  . Dyslipidemia 06/20/2018  . ARTHRITIS, LEFT KNEE 11/06/2009  . DEPRESSION/ANXIETY 11/29/2008  . POSTMENOPAUSAL STATUS 11/29/2008  . HEADACHE, TENSION 02/03/2008  . LEG CRAMPS 02/03/2008  . MICROALBUMINURIA 07/27/2007  . HYPOKALEMIA 07/26/2007  . ACUTE CYSTITIS 07/26/2007  . DYSLIPIDEMIA 04/29/2007  . Essential hypertension 04/29/2007  . POSTPROCEDURAL STATE, CATARACT EXTRACTION 04/29/2007    Ladamien Rammel PTA 08/01/2018, 8:08 AM  [facility] [address] [place], [place], [address] Phone: [phone]   Fax:  [phone]  Name: Tara Collins MRN: [id number] Date of Birth: 1934/12/26

## 2018-08-04 ENCOUNTER — Ambulatory Visit: Payer: Medicare HMO | Admitting: Physical Therapy

## 2018-08-04 ENCOUNTER — Encounter: Payer: Self-pay | Admitting: Physical Therapy

## 2018-08-04 DIAGNOSIS — M25561 Pain in right knee: Secondary | ICD-10-CM | POA: Diagnosis not present

## 2018-08-04 DIAGNOSIS — R6889 Other general symptoms and signs: Secondary | ICD-10-CM | POA: Diagnosis not present

## 2018-08-04 DIAGNOSIS — R600 Localized edema: Secondary | ICD-10-CM

## 2018-08-04 DIAGNOSIS — R2689 Other abnormalities of gait and mobility: Secondary | ICD-10-CM

## 2018-08-04 DIAGNOSIS — M6281 Muscle weakness (generalized): Secondary | ICD-10-CM | POA: Diagnosis not present

## 2018-08-04 DIAGNOSIS — M25661 Stiffness of right knee, not elsewhere classified: Secondary | ICD-10-CM | POA: Diagnosis not present

## 2018-08-04 NOTE — Therapy (Signed)
[place], [place], [address] Phone: [phone]   Fax:  [phone]  Physical Therapy Treatment  Patient Details  Name: Tara Collins MRN: [id number] Date of Birth: 1935-06-22 Referring Provider (PT): Dr Carter Kitten   Encounter Date: 08/04/2018  PT End of Session - 08/04/18 0846    Visit Number  3    Number of Visits  18    Date for PT Re-Evaluation  09/06/18    PT Start Time  0846    PT Stop Time  0935    PT Time Calculation (min)  49 min    Activity Tolerance  Patient tolerated treatment well       Past Medical History:  Diagnosis Date  . Arthritis   . Hepatitis B 03/1963  . Hypertension   . PONV (postoperative nausea and vomiting)   . Primary localized osteoarthritis of right knee 07/05/2018  . Seasonal allergies     Past Surgical History:  Procedure Laterality Date  . ABDOMINAL HYSTERECTOMY  01/1991  . APPENDECTOMY    . CATARACT EXTRACTION, BILATERAL    . CHOLECYSTECTOMY  10/2017  . PARTIAL KNEE ARTHROPLASTY Right 07/05/2018   Procedure: UNICOMPARTMENTAL KNEE;  Surgeon: Marchia Bond, MD;  Location: [facility];  Service: Orthopedics;  Laterality: Right;  . REPLACEMENT TOTAL KNEE Left 2012    There were no vitals filed for this visit.  Subjective Assessment - 08/04/18 0850    Subjective  Pt reports she has been lazy recently with her HEP and knows she has to do better.     Patient Stated Goals  get to walk normal - not need a cane    Currently in Pain?  Yes    Pain Score  4     Pain Location  Knee    Pain Orientation  Right;Medial    Pain Descriptors / Indicators  Aching;Dull    Pain Type  Surgical pain         [facility] PT Assessment - 08/04/18 0001      Assessment   Medical Diagnosis  Rt uni TKA    Referring Provider (PT)  Dr Carter Kitten    Onset Date/Surgical Date  07/05/18    Next MD Visit  08/15/18      AROM   Right Knee Extension  -2    Right Knee Flexion  99                    [facility] Adult PT Treatment/Exercise - 08/04/18 0001      Exercises   Exercises  Knee/Hip      Knee/Hip Exercises: Aerobic   Stationary Bike  for ROM x 5'    Nustep  L3x5' U/LEs       Knee/Hip Exercises: Standing   Lateral Step Up  Right;Step Height: 6"   30 reps   Forward Step Up  Right;Step Height: 6"   30 reps     Knee/Hip Exercises: Seated   Heel Slides  AAROM;Right;10 reps   10 sec holds after scooting FWD to bend Rt knee   Other Seated Knee/Hip Exercises  3x8 long sit rt SLR     Hamstring Curl  Strengthening;Right;20 reps   red band     Knee/Hip Exercises: Supine   Short Arc Quad Sets  Strengthening;Right;3 sets;10 reps   with 6#     Modalities   Modalities  Cryotherapy      Cryotherapy   Number Minutes Cryotherapy  10  Minutes    Cryotherapy Location  Knee   Rt   Type of Cryotherapy  Ice pack               PT Short Term Goals - 08/04/18 6761      PT SHORT TERM GOAL #1   Title  I with initial HEP ( 08/16/18)     Status  Achieved      PT SHORT TERM GOAL #2   Title  increase Rt knee motion -3 extension to 100 flexion ( 08/16/18)     Status  Partially Met        PT Long Term Goals - 08/04/18 9509      PT LONG TERM GOAL #1   Title  I with advanced HEP ( 09/06/18)     Status  On-going      PT LONG TERM GOAL #2   Title  increased Rt hip and knee strength =/> 5-/5 to allow gait in apartment without AD ( 09/06/18)     Status  On-going      PT LONG TERM GOAL #3   Title  improve FOTO =/< 44% limited 9 09/06/18)     Status  On-going      PT LONG TERM GOAL #4   Title  ambuate up down a flight of stairs with no difficulty using a railing to allow her to walk in her daughters house ( 09/06/18)     Status  On-going      PT LONG TERM GOAL #5   Title  improve Rt knee ROM to within 5 degrees flexion of Lt ( 09/06/18)     Status  On-going            Plan - 08/04/18 0925    Clinical Impression Statement  Derika had increased Rt  knee motion, partially meeting her STG for ROM.  She tolerated increased activity and strengthening today with min to mod fatigue.  Progressing well     Rehab Potential  Excellent    PT Frequency  3x / week    PT Duration  6 weeks    PT Treatment/Interventions  DME Instruction;Balance training;Scar mobilization;Passive range of motion;Neuromuscular re-education;Stair training;Moist Heat;Ultrasound;Functional mobility training;Manual techniques;Dry needling;Patient/family education;Therapeutic activities;Cryotherapy;Electrical Stimulation;Therapeutic exercise;Vasopneumatic Device    PT Next Visit Plan  LE sterngthening and knee ROM, begin gait without AD    Consulted and Agree with Plan of Care  Patient       Patient will benefit from skilled therapeutic intervention in order to improve the following deficits and impairments:  Abnormal gait, Pain, Decreased range of motion, Decreased strength, Difficulty walking, Decreased balance  Visit Diagnosis: Stiffness of right knee, not elsewhere classified  Acute pain of right knee  Localized edema  Other abnormalities of gait and mobility  Muscle weakness (generalized)     Problem List Patient Active Problem List   Diagnosis Date Noted  . Primary localized osteoarthritis of right knee 07/05/2018  . Status post right partial knee replacement 07/05/2018  . Dyslipidemia 06/20/2018  . ARTHRITIS, LEFT KNEE 11/06/2009  . DEPRESSION/ANXIETY 11/29/2008  . POSTMENOPAUSAL STATUS 11/29/2008  . HEADACHE, TENSION 02/03/2008  . LEG CRAMPS 02/03/2008  . MICROALBUMINURIA 07/27/2007  . HYPOKALEMIA 07/26/2007  . ACUTE CYSTITIS 07/26/2007  . DYSLIPIDEMIA 04/29/2007  . Essential hypertension 04/29/2007  . POSTPROCEDURAL STATE, CATARACT EXTRACTION 04/29/2007    Jeral Pinch PT  08/04/2018, 9:27 AM  [facility] [address] [place], [place], [address] Phone: [phone]  Fax:   [phone]  Name: DECIE VERNE MRN: [id number] Date of Birth: 12-Sep-1934

## 2018-08-09 ENCOUNTER — Ambulatory Visit: Payer: Medicare HMO | Admitting: Physical Therapy

## 2018-08-09 ENCOUNTER — Encounter: Payer: Self-pay | Admitting: Physical Therapy

## 2018-08-09 DIAGNOSIS — M25561 Pain in right knee: Secondary | ICD-10-CM | POA: Diagnosis not present

## 2018-08-09 DIAGNOSIS — M25661 Stiffness of right knee, not elsewhere classified: Secondary | ICD-10-CM

## 2018-08-09 DIAGNOSIS — R2689 Other abnormalities of gait and mobility: Secondary | ICD-10-CM

## 2018-08-09 DIAGNOSIS — R600 Localized edema: Secondary | ICD-10-CM | POA: Diagnosis not present

## 2018-08-09 DIAGNOSIS — M6281 Muscle weakness (generalized): Secondary | ICD-10-CM

## 2018-08-09 NOTE — Therapy (Signed)
[place], [place], [address] Phone: [phone]   Fax:  [phone]  Physical Therapy Treatment  Patient Details  Name: Tara Collins MRN: [id number] Date of Birth: [birth year] Referring Provider (PT): Dr Carter Kitten   Encounter Date: 08/09/2018  PT End of Session - 08/09/18 0842    Visit Number  4    Number of Visits  18    Date for PT Re-Evaluation  09/06/18    PT Start Time  0842    PT Stop Time  0936    PT Time Calculation (min)  54 min    Activity Tolerance  Patient tolerated treatment well       Past Medical History:  Diagnosis Date  . Arthritis   . Hepatitis B 03/1963  . Hypertension   . PONV (postoperative nausea and vomiting)   . Primary localized osteoarthritis of right knee 07/05/2018  . Seasonal allergies     Past Surgical History:  Procedure Laterality Date  . ABDOMINAL HYSTERECTOMY  01/1991  . APPENDECTOMY    . CATARACT EXTRACTION, BILATERAL    . CHOLECYSTECTOMY  10/2017  . PARTIAL KNEE ARTHROPLASTY Right 07/05/2018   Procedure: UNICOMPARTMENTAL KNEE;  Surgeon: Marchia Bond, MD;  Location: [facility];  Service: Orthopedics;  Laterality: Right;  . REPLACEMENT TOTAL KNEE Left 2012    There were no vitals filed for this visit.  Subjective Assessment - 08/09/18 0843    Subjective  Tara Collins reports she is more sore today than she has been.  Hasn't done anything different.  Not sure if its the rainy weather.     Patient Stated Goals  get to walk normal - not need a cane    Currently in Pain?  Yes    Pain Score  7     Pain Location  Neck    Pain Orientation  Right;Medial    Pain Descriptors / Indicators  Aching    Pain Type  Surgical pain    Pain Onset  More than a month ago    Pain Frequency  Constant    Aggravating Factors   not sure what flared it up today    Pain Relieving Factors  ice and meds, gently exercise.          [facility] PT Assessment - 08/09/18 0001      Assessment    Medical Diagnosis  Rt uni TKA    Referring Provider (PT)  Dr Carter Kitten    Onset Date/Surgical Date  07/05/18    Next MD Visit  08/15/18      AROM   Right/Left Knee  Right    Right Knee Extension  0    Right Knee Flexion  105      Strength   Right/Left Hip  Right    Right Hip Flexion  5/5    Right/Left Knee  Right    Right Knee Flexion  4+/5    Right Knee Extension  --   5-/5                  [facility] Adult PT Treatment/Exercise - 08/09/18 0001      Exercises   Exercises  Knee/Hip      Knee/Hip Exercises: Aerobic   Stationary Bike  --    Nustep  L3x5' U/LEs    pt reported her knee felt better after about 1 min     Knee/Hip Exercises: Standing   SLS  Rt with Lt toe taps  FWD/side/BWD, VC for form and techinque    SLS with Vectors  SLS on green therapad 5x30sec    Other Standing Knee Exercises  side stepping and BWDs walking       Knee/Hip Exercises: Supine   Short Arc Quad Sets  Strengthening;Right;10 reps   15 sec holds   Straight Leg Raise with External Rotation  Right;2 sets;15 reps      Knee/Hip Exercises: Sidelying   Hip ADduction  Strengthening;Right;2 sets;15 reps      Modalities   Modalities  Cryotherapy;Ultrasound      Cryotherapy   Number Minutes Cryotherapy  10 Minutes    Cryotherapy Location  Knee    Type of Cryotherapy  Ice pack      Ultrasound   Ultrasound Location  medial Rt knee    Ultrasound Parameters  50%, 1.31mz, 1.0w/cm2    Ultrasound Goals  Pain               PT Short Term Goals - 08/09/18 0848      PT SHORT TERM GOAL #1   Title  I with initial HEP ( 08/16/18)     Status  Achieved      PT SHORT TERM GOAL #2   Title  increase Rt knee motion -3 extension to 100 flexion ( 08/16/18)     Status  Achieved        PT Long Term Goals - 08/09/18 0848      PT LONG TERM GOAL #1   Title  I with advanced HEP ( 09/06/18)     Status  On-going      PT LONG TERM GOAL #2   Title  increased Rt hip and knee strength =/> 5-/5  to allow gait in apartment without AD ( 09/06/18)     Status  Partially Met      PT LONG TERM GOAL #3   Title  improve FOTO =/< 44% limited 9 09/06/18)     Status  On-going      PT LONG TERM GOAL #4   Title  ambuate up down a flight of stairs with no difficulty using a railing to allow her to walk in her daughters house ( 09/06/18)     Status  On-going      PT LONG TERM GOAL #5   Title  improve Rt knee ROM to within 5 degrees flexion of Lt ( 09/06/18)     Status  On-going            Plan - 08/09/18 0905    Clinical Impression Statement  LSynthiacontinues to have improved Rt knee motion and strength. She has met her STGs and progressing to the LTGs.  She presented today with increased pain in the medial Rt knee, this settled down some with gentle exercise and ultrasound.  Would benefit from continued PT to wean off cane and restore PLOF     Rehab Potential  Excellent    PT Frequency  3x / week    PT Duration  6 weeks    PT Treatment/Interventions  DME Instruction;Balance training;Scar mobilization;Passive range of motion;Neuromuscular re-education;Stair training;Moist Heat;Ultrasound;Functional mobility training;Manual techniques;Dry needling;Patient/family education;Therapeutic activities;Cryotherapy;Electrical Stimulation;Therapeutic exercise;Vasopneumatic Device    PT Next Visit Plan  assess response to [place], cont strengthening and if pain is improved practice gait without cane.     Consulted and Agree with Plan of Care  Patient       Patient will benefit from skilled therapeutic intervention in order to  improve the following deficits and impairments:  Abnormal gait, Pain, Decreased range of motion, Decreased strength, Difficulty walking, Decreased balance  Visit Diagnosis: Stiffness of right knee, not elsewhere classified  Acute pain of right knee  Localized edema  Other abnormalities of gait and mobility  Muscle weakness (generalized)     Problem List Patient Active  Problem List   Diagnosis Date Noted  . Primary localized osteoarthritis of right knee 07/05/2018  . Status post right partial knee replacement 07/05/2018  . Dyslipidemia 06/20/2018  . ARTHRITIS, LEFT KNEE 11/06/2009  . DEPRESSION/ANXIETY 11/29/2008  . POSTMENOPAUSAL STATUS 11/29/2008  . HEADACHE, TENSION 02/03/2008  . LEG CRAMPS 02/03/2008  . MICROALBUMINURIA 07/27/2007  . HYPOKALEMIA 07/26/2007  . ACUTE CYSTITIS 07/26/2007  . DYSLIPIDEMIA 04/29/2007  . Essential hypertension 04/29/2007  . POSTPROCEDURAL STATE, CATARACT EXTRACTION 04/29/2007    Jeral Pinch PT  08/09/2018, 9:23 AM  [facility] [address] [place], [place], [address] Phone: [phone]   Fax:  [phone]  Name: Tara Collins MRN: [id number] Date of Birth: Sep 04, 1934

## 2018-08-11 ENCOUNTER — Encounter: Payer: Self-pay | Admitting: Physical Therapy

## 2018-08-11 ENCOUNTER — Ambulatory Visit: Payer: Medicare HMO | Admitting: Physical Therapy

## 2018-08-11 DIAGNOSIS — M25561 Pain in right knee: Secondary | ICD-10-CM

## 2018-08-11 DIAGNOSIS — M25661 Stiffness of right knee, not elsewhere classified: Secondary | ICD-10-CM | POA: Diagnosis not present

## 2018-08-11 DIAGNOSIS — R2689 Other abnormalities of gait and mobility: Secondary | ICD-10-CM | POA: Diagnosis not present

## 2018-08-11 DIAGNOSIS — R600 Localized edema: Secondary | ICD-10-CM

## 2018-08-11 DIAGNOSIS — M6281 Muscle weakness (generalized): Secondary | ICD-10-CM

## 2018-08-11 NOTE — Therapy (Signed)
[place], [place], [address] Phone: [phone]   Fax:  [phone]  Physical Therapy Treatment  Patient Details  Name: Tara Collins MRN: [id number] Date of Birth: 03-08-1935 Referring Provider (PT): Dr Carter Kitten   Encounter Date: 08/11/2018  PT End of Session - 08/11/18 0800    Visit Number  5    Number of Visits  18    Date for PT Re-Evaluation  09/06/18    Authorization Type  10th visit progress note    PT Start Time  0800    PT Stop Time  0850    PT Time Calculation (min)  50 min    Activity Tolerance  Patient tolerated treatment well       Past Medical History:  Diagnosis Date  . Arthritis   . Hepatitis B 03/1963  . Hypertension   . PONV (postoperative nausea and vomiting)   . Primary localized osteoarthritis of right knee 07/05/2018  . Seasonal allergies     Past Surgical History:  Procedure Laterality Date  . ABDOMINAL HYSTERECTOMY  01/1991  . APPENDECTOMY    . CATARACT EXTRACTION, BILATERAL    . CHOLECYSTECTOMY  10/2017  . PARTIAL KNEE ARTHROPLASTY Right 07/05/2018   Procedure: UNICOMPARTMENTAL KNEE;  Surgeon: Marchia Bond, MD;  Location: [facility];  Service: Orthopedics;  Laterality: Right;  . REPLACEMENT TOTAL KNEE Left 2012    There were no vitals filed for this visit.  Subjective Assessment - 08/11/18 0804    Subjective  Pt reports she was a little sore yesterday so she walked around and did some pedaling on a bike and feels betternow    Patient Stated Goals  get to walk normal - not need a cane    Currently in Pain?  No/denies                       [facility] Adult PT Treatment/Exercise - 08/11/18 0001      Ambulation/Gait   Ambulation/Gait  Yes    Ambulation/Gait Assistance  5: Supervision    Ambulation/Gait Assistance Details  --   around the clinic   Gait Pattern  --   focused on heel toe pattern    Pre-Gait Activities  15 reps Rt LE hip flex to  abduction and back, stepping over low hurdles.       Exercises   Exercises  Knee/Hip      Knee/Hip Exercises: Stretches   Passive Hamstring Stretch  Right;2 reps;30 seconds   supine with strap, added in knee presses   Quad Stretch  Right;2 reps;30 seconds   prone with strap   Piriformis Stretch Limitations  figure 4 stretch Rt     Gastroc Stretch  Both;2 reps;30 seconds   on slant board     Knee/Hip Exercises: Aerobic   Nustep  L4x5' U/LEs       Knee/Hip Exercises: Seated   Other Seated Knee/Hip Exercises  3x10 green band Rt dorsiflexion      Knee/Hip Exercises: Supine   Bridges Limitations  3x10 hip extension, legs out straight on bolster VC for form      Knee/Hip Exercises: Sidelying   Other Sidelying Knee/Hip Exercises  10 reps each Rt LE FWD/BWD kicks, CW/CCW circles and FWD BWD taps       Modalities   Modalities  Cryotherapy      Cryotherapy   Number Minutes Cryotherapy  10 Minutes    Cryotherapy Location  Knee  Type of Cryotherapy  Ice pack               PT Short Term Goals - 08/09/18 0848      PT SHORT TERM GOAL #1   Title  I with initial HEP ( 08/16/18)     Status  Achieved      PT SHORT TERM GOAL #2   Title  increase Rt knee motion -3 extension to 100 flexion ( 08/16/18)     Status  Achieved        PT Long Term Goals - 08/09/18 0848      PT LONG TERM GOAL #1   Title  I with advanced HEP ( 09/06/18)     Status  On-going      PT LONG TERM GOAL #2   Title  increased Rt hip and knee strength =/> 5-/5 to allow gait in apartment without AD ( 09/06/18)     Status  Partially Met      PT LONG TERM GOAL #3   Title  improve FOTO =/< 44% limited 9 09/06/18)     Status  On-going      PT LONG TERM GOAL #4   Title  ambuate up down a flight of stairs with no difficulty using a railing to allow her to walk in her daughters house ( 09/06/18)     Status  On-going      PT LONG TERM GOAL #5   Title  improve Rt knee ROM to within 5 degrees flexion of Lt (  09/06/18)     Status  On-going            Plan - 08/11/18 0838    Clinical Impression Statement  Tara Collins did well with ambulation, she does occassionally catch her Rt foot during swing phase of gait.  Tolerated higher level strengthening exercise well with no reports of pain.  She was encouraged to begin walking without her cane inher house and only use it in the community.     Rehab Potential  Excellent    PT Frequency  3x / week    PT Duration  6 weeks    PT Treatment/Interventions  DME Instruction;Balance training;Scar mobilization;Passive range of motion;Neuromuscular re-education;Stair training;Moist Heat;Ultrasound;Functional mobility training;Manual techniques;Dry needling;Patient/family education;Therapeutic activities;Cryotherapy;Electrical Stimulation;Therapeutic exercise;Vasopneumatic Device    PT Next Visit Plan  cont strengthening and gait/balance    Consulted and Agree with Plan of Care  Patient       Patient will benefit from skilled therapeutic intervention in order to improve the following deficits and impairments:  Abnormal gait, Pain, Decreased range of motion, Decreased strength, Difficulty walking, Decreased balance  Visit Diagnosis: Stiffness of right knee, not elsewhere classified  Acute pain of right knee  Localized edema  Other abnormalities of gait and mobility  Muscle weakness (generalized)     Problem List Patient Active Problem List   Diagnosis Date Noted  . Primary localized osteoarthritis of right knee 07/05/2018  . Status post right partial knee replacement 07/05/2018  . Dyslipidemia 06/20/2018  . ARTHRITIS, LEFT KNEE 11/06/2009  . DEPRESSION/ANXIETY 11/29/2008  . POSTMENOPAUSAL STATUS 11/29/2008  . HEADACHE, TENSION 02/03/2008  . LEG CRAMPS 02/03/2008  . MICROALBUMINURIA 07/27/2007  . HYPOKALEMIA 07/26/2007  . ACUTE CYSTITIS 07/26/2007  . DYSLIPIDEMIA 04/29/2007  . Essential hypertension 04/29/2007  . POSTPROCEDURAL STATE, CATARACT  EXTRACTION 04/29/2007    Jeral Pinch PT  08/11/2018, 8:42 AM  [facility] [address] [place], [place], [address]  Phone: [phone]   Fax:  [phone]  Name: Tara Collins MRN: [id number] Date of Birth: 1935/07/05

## 2018-08-12 ENCOUNTER — Encounter: Payer: Self-pay | Admitting: Physical Therapy

## 2018-08-12 ENCOUNTER — Ambulatory Visit: Payer: Medicare HMO | Admitting: Physical Therapy

## 2018-08-12 DIAGNOSIS — R600 Localized edema: Secondary | ICD-10-CM | POA: Diagnosis not present

## 2018-08-12 DIAGNOSIS — M25561 Pain in right knee: Secondary | ICD-10-CM

## 2018-08-12 DIAGNOSIS — M25661 Stiffness of right knee, not elsewhere classified: Secondary | ICD-10-CM | POA: Diagnosis not present

## 2018-08-12 DIAGNOSIS — M6281 Muscle weakness (generalized): Secondary | ICD-10-CM

## 2018-08-12 DIAGNOSIS — R2689 Other abnormalities of gait and mobility: Secondary | ICD-10-CM

## 2018-08-12 NOTE — Therapy (Signed)
[place], [place], [address] Phone: [phone]   Fax:  [phone]  Physical Therapy Treatment  Patient Details  Name: Tara Collins MRN: [id number] Date of Birth: October 29, 1934 Referring Provider (PT): Dr Carter Kitten   Encounter Date: 08/12/2018  PT End of Session - 08/12/18 1003    Visit Number  6    Number of Visits  18    Date for PT Re-Evaluation  09/06/18    Authorization Type  10th visit progress note    PT Start Time  0930    PT Stop Time  1010    PT Time Calculation (min)  40 min    Activity Tolerance  Patient tolerated treatment well    Behavior During Therapy  [facility] for tasks assessed/performed       Past Medical History:  Diagnosis Date  . Arthritis   . Hepatitis B 03/1963  . Hypertension   . PONV (postoperative nausea and vomiting)   . Primary localized osteoarthritis of right knee 07/05/2018  . Seasonal allergies     Past Surgical History:  Procedure Laterality Date  . ABDOMINAL HYSTERECTOMY  01/1991  . APPENDECTOMY    . CATARACT EXTRACTION, BILATERAL    . CHOLECYSTECTOMY  10/2017  . PARTIAL KNEE ARTHROPLASTY Right 07/05/2018   Procedure: UNICOMPARTMENTAL KNEE;  Surgeon: Marchia Bond, MD;  Location: [facility];  Service: Orthopedics;  Laterality: Right;  . REPLACEMENT TOTAL KNEE Left 2012    There were no vitals filed for this visit.  Subjective Assessment - 08/12/18 0933    Subjective  Pt. reports did OK yesterday but more sore/stiff this AM which she attributes to colder weather.                       [facility] Adult PT Treatment/Exercise - 08/12/18 0001      Knee/Hip Exercises: Aerobic   Nustep  L4 x 5 min UE/LE      Knee/Hip Exercises: Standing   Heel Raises  Both;2 sets;10 reps    Heel Raises Limitations  Airex    Hip Abduction  Stengthening;Right;2 sets;10 reps    Abduction Limitations  2 lbs.    Lateral Step Up  Right;1 set;10 reps;Hand Hold: 1;Step  Height: 6"    Forward Step Up  Right;2 sets;10 reps;Hand Hold: 1;Step Height: 6"    Functional Squat Limitations  Partial squat at counter x 15 reps    Rocker Board  1 minute      Knee/Hip Exercises: Seated   Long Arc Quad  Strengthening;Right;2 sets;10 reps    Long Arc Quad Weight  2 lbs.    Other Seated Knee/Hip Exercises  3x10 green band right ankle DF   supine today instead of seated     Knee/Hip Exercises: Supine   Short Arc Quad Sets  Strengthening;Right;2 sets;10 reps    Short Arc Quad Sets Limitations  2 lbs.    Heel Slides  Right;2 sets;10 reps   heel slide AAROM with therapist overpressure for flexion   Bridges Limitations  3x10 legs on bolster    Straight Leg Raises  AROM;Right;2 sets;10 reps    Knee Extension  --   gentle supine ext with OP 5 sec x 10   Other Supine Knee/Hip Exercises  supine manual right gastroc stretch             PT Education - 08/12/18 1003    Education Details  expected healing timeframe, post-surgical soreness  vs. delayed onset muscle soreness    Person(s) Educated  Patient    Methods  Explanation    Comprehension  Verbalized understanding       PT Short Term Goals - 08/09/18 0848      PT SHORT TERM GOAL #1   Title  I with initial HEP ( 08/16/18)     Status  Achieved      PT SHORT TERM GOAL #2   Title  increase Rt knee motion -3 extension to 100 flexion ( 08/16/18)     Status  Achieved        PT Long Term Goals - 08/09/18 0848      PT LONG TERM GOAL #1   Title  I with advanced HEP ( 09/06/18)     Status  On-going      PT LONG TERM GOAL #2   Title  increased Rt hip and knee strength =/> 5-/5 to allow gait in apartment without AD ( 09/06/18)     Status  Partially Met      PT LONG TERM GOAL #3   Title  improve FOTO =/< 44% limited 9 09/06/18)     Status  On-going      PT LONG TERM GOAL #4   Title  ambuate up down a flight of stairs with no difficulty using a railing to allow her to walk in her daughters house ( 09/06/18)      Status  On-going      PT LONG TERM GOAL #5   Title  improve Rt knee ROM to within 5 degrees flexion of Lt ( 09/06/18)     Status  On-going            Plan - 08/12/18 1005    Clinical Impression Statement  Mild fatigue but tolerated closed chain activities/strengthening well including addition partial squats. Still lack some terminal knee extension with gait, mild decreased ankle DF but overall progressing well with functional status re: therapy goals.    Rehab Potential  Excellent    PT Frequency  3x / week    PT Duration  6 weeks    PT Treatment/Interventions  DME Instruction;Balance training;Scar mobilization;Passive range of motion;Neuromuscular re-education;Stair training;Moist Heat;Ultrasound;Functional mobility training;Manual techniques;Dry needling;Patient/family education;Therapeutic activities;Cryotherapy;Electrical Stimulation;Therapeutic exercise;Vasopneumatic Device    PT Next Visit Plan  cont strengthening and gait/balance    PT Home Exercise Plan  [facility] PT exercises.  Gait with cane.       Patient will benefit from skilled therapeutic intervention in order to improve the following deficits and impairments:  Abnormal gait, Pain, Decreased range of motion, Decreased strength, Difficulty walking, Decreased balance  Visit Diagnosis: Stiffness of right knee, not elsewhere classified  Acute pain of right knee  Localized edema  Other abnormalities of gait and mobility  Muscle weakness (generalized)     Problem List Patient Active Problem List   Diagnosis Date Noted  . Primary localized osteoarthritis of right knee 07/05/2018  . Status post right partial knee replacement 07/05/2018  . Dyslipidemia 06/20/2018  . ARTHRITIS, LEFT KNEE 11/06/2009  . DEPRESSION/ANXIETY 11/29/2008  . POSTMENOPAUSAL STATUS 11/29/2008  . HEADACHE, TENSION 02/03/2008  . LEG CRAMPS 02/03/2008  . MICROALBUMINURIA 07/27/2007  . HYPOKALEMIA 07/26/2007  . ACUTE CYSTITIS 07/26/2007  .  DYSLIPIDEMIA 04/29/2007  . Essential hypertension 04/29/2007  . POSTPROCEDURAL STATE, CATARACT EXTRACTION 04/29/2007    Beaulah Dinning, PT, DPT 08/12/18 10:12 AM  [facility] [address] [place], [place], [address] Phone:  [phone]   Fax:  [phone]  Name: MADI BONFIGLIO MRN: [id number] Date of Birth: February 12, 1935

## 2018-08-15 ENCOUNTER — Ambulatory Visit: Payer: Medicare HMO | Admitting: Physical Therapy

## 2018-08-15 ENCOUNTER — Encounter: Payer: Self-pay | Admitting: Physical Therapy

## 2018-08-15 DIAGNOSIS — M6281 Muscle weakness (generalized): Secondary | ICD-10-CM

## 2018-08-15 DIAGNOSIS — R2689 Other abnormalities of gait and mobility: Secondary | ICD-10-CM

## 2018-08-15 DIAGNOSIS — M25661 Stiffness of right knee, not elsewhere classified: Secondary | ICD-10-CM

## 2018-08-15 DIAGNOSIS — R600 Localized edema: Secondary | ICD-10-CM

## 2018-08-15 DIAGNOSIS — M1711 Unilateral primary osteoarthritis, right knee: Secondary | ICD-10-CM | POA: Diagnosis not present

## 2018-08-15 DIAGNOSIS — M25561 Pain in right knee: Secondary | ICD-10-CM | POA: Diagnosis not present

## 2018-08-15 NOTE — Therapy (Signed)
[place], [place], [address] Phone: [phone]   Fax:  [phone]  Physical Therapy Treatment  Patient Details  Name: Tara Collins MRN: [id number] Date of Birth: August 27, 1934 Referring Provider (PT): Dr Carter Kitten   Encounter Date: 08/15/2018  PT End of Session - 08/15/18 0758    Visit Number  7    Number of Visits  18    Date for PT Re-Evaluation  09/06/18    Authorization Type  n    PT Start Time  0758    PT Stop Time  0847    PT Time Calculation (min)  49 min    Activity Tolerance  Patient tolerated treatment well       Past Medical History:  Diagnosis Date  . Arthritis   . Hepatitis B 03/1963  . Hypertension   . PONV (postoperative nausea and vomiting)   . Primary localized osteoarthritis of right knee 07/05/2018  . Seasonal allergies     Past Surgical History:  Procedure Laterality Date  . ABDOMINAL HYSTERECTOMY  01/1991  . APPENDECTOMY    . CATARACT EXTRACTION, BILATERAL    . CHOLECYSTECTOMY  10/2017  . PARTIAL KNEE ARTHROPLASTY Right 07/05/2018   Procedure: UNICOMPARTMENTAL KNEE;  Surgeon: Marchia Bond, MD;  Location: [facility];  Service: Orthopedics;  Laterality: Right;  . REPLACEMENT TOTAL KNEE Left 2012    There were no vitals filed for this visit.  Subjective Assessment - 08/15/18 0758    Subjective  Pt reports she hasn't walked as much as usual because its been cold outside,otherwise doing well. She would like more bend in her knee and the knee is sore when she goes to sleep.      Patient Stated Goals  get to walk normal - not need a cane    Currently in Pain?  No/denies         [facility] PT Assessment - 08/15/18 0001      Assessment   Medical Diagnosis  Rt uni TKA    Referring Provider (PT)  Dr Carter Kitten    Onset Date/Surgical Date  07/05/18    Next MD Visit  08/15/18      Observation/Other Assessments   Focus on Therapeutic Outcomes (FOTO)   47% limited      ROM  / Strength   AROM / PROM / Strength  AROM;Strength      AROM   Right/Left Knee  Right;Left    Right Knee Extension  0    Right Knee Flexion  114      Strength   Right/Left Hip  Right    Right Hip Flexion  5/5    Right Hip Extension  5/5    Right Hip ABduction  4/5    Right/Left Knee  Right    Right Knee Flexion  --   5-/5   Right Knee Extension  5/5                   [facility] Adult PT Treatment/Exercise - 08/15/18 0001      Neuro Re-ed    Neuro Re-ed Details   standing on rocker board, balance and weight shifts side/side and FWD/BWD      Exercises   Exercises  Knee/Hip      Knee/Hip Exercises: Stretches   Passive Hamstring Stretch  Right;2 reps;30 seconds   supine with strap and knee presse   Quad Stretch  Right;3 reps;60 seconds   prone with strap  Gastroc Stretch  Both;2 reps;30 seconds   on slant board     Knee/Hip Exercises: Aerobic   Nustep  L4 x 5 min UE/LE      Knee/Hip Exercises: Machines for Strengthening   Cybex Knee Extension  3x10 15#    Cybex Knee Flexion  3x10, 15#      Knee/Hip Exercises: Sidelying   Other Sidelying Knee/Hip Exercises  10 reps each Rt LE FWD/BWD kicks, CW/CCW circles and FWD BWD taps       Cryotherapy   Number Minutes Cryotherapy  10 Minutes    Cryotherapy Location  Knee    Type of Cryotherapy  Ice pack               PT Short Term Goals - 08/15/18 0809      PT SHORT TERM GOAL #1   Title  I with initial HEP ( 08/16/18)     Status  Achieved      PT SHORT TERM GOAL #2   Title  increase Rt knee motion -3 extension to 100 flexion ( 08/16/18)     Status  Achieved        PT Long Term Goals - 08/15/18 0809      PT LONG TERM GOAL #1   Title  I with advanced HEP ( 09/06/18)     Status  On-going      PT LONG TERM GOAL #2   Title  increased Rt hip and knee strength =/> 5-/5 to allow gait in apartment without AD ( 09/06/18)     Baseline  all except Rt hip abduction, does demo some functional Rt quad weakness  with step downs and squats    Status  Partially Met      PT LONG TERM GOAL #3   Title  improve FOTO =/< 44% limited ( 09/06/18)     Baseline  scored 47% limited today    Status  On-going      PT LONG TERM GOAL #4   Title  ambuate up down a flight of stairs with no difficulty using a railing to allow her to walk in her daughters house ( 09/06/18)     Status  Achieved      PT LONG TERM GOAL #5   Title  improve Rt knee ROM to within 5 degrees flexion of Lt ( 09/06/18)     Baseline  Rt knee is 10 degredd less from [place] - 08/15/18 1224    Clinical Impression Statement  Tara Collins's knee ROM and lower body strength continue to improve.  She has 10 degrees less flexion in the Rt knee compared to left and full extension.  Her hip strength is WNL in all  except hip abduction, RT quad has functional weakness with eccentric work.  She would benefit from continued therapy to further increase her knee flexion, LE functional ability, she lacks terminal knee extension with stance, and wean off the cane in the community.   Tara Collins has met her STGs and partially met her LTGs   Rehab Potential  Excellent    PT Frequency  3x / week    PT Duration  6 weeks    PT Treatment/Interventions  DME Instruction;Balance training;Scar mobilization;Passive range of motion;Neuromuscular re-education;Stair training;Moist Heat;Ultrasound;Functional mobility training;Manual techniques;Dry needling;Patient/family education;Therapeutic activities;Cryotherapy;Electrical Stimulation;Therapeutic exercise;Vasopneumatic Device    PT Next Visit Plan  cont strengthening and gait/balance    Consulted and Agree with Plan  of Care  Patient       Patient will benefit from skilled therapeutic intervention in order to improve the following deficits and impairments:  Abnormal gait, Pain, Decreased range of motion, Decreased strength, Difficulty walking, Decreased balance  Visit Diagnosis: Stiffness of right knee, not elsewhere  classified  Acute pain of right knee  Other abnormalities of gait and mobility  Muscle weakness (generalized)  Localized edema     Problem List Patient Active Problem List   Diagnosis Date Noted  . Primary localized osteoarthritis of right knee 07/05/2018  . Status post right partial knee replacement 07/05/2018  . Dyslipidemia 06/20/2018  . ARTHRITIS, LEFT KNEE 11/06/2009  . DEPRESSION/ANXIETY 11/29/2008  . POSTMENOPAUSAL STATUS 11/29/2008  . HEADACHE, TENSION 02/03/2008  . LEG CRAMPS 02/03/2008  . MICROALBUMINURIA 07/27/2007  . HYPOKALEMIA 07/26/2007  . ACUTE CYSTITIS 07/26/2007  . DYSLIPIDEMIA 04/29/2007  . Essential hypertension 04/29/2007  . POSTPROCEDURAL STATE, CATARACT EXTRACTION 04/29/2007    Jeral Pinch PT  08/15/2018, 8:35 AM  [facility] [address] [place], [place], [address] Phone: [phone]   Fax:  [phone]  Name: Tara Collins MRN: [id number] Date of Birth: 1934/09/08

## 2018-08-17 ENCOUNTER — Ambulatory Visit: Payer: Medicare HMO | Admitting: Physical Therapy

## 2018-08-17 ENCOUNTER — Encounter: Payer: Self-pay | Admitting: Physical Therapy

## 2018-08-17 DIAGNOSIS — M25661 Stiffness of right knee, not elsewhere classified: Secondary | ICD-10-CM | POA: Diagnosis not present

## 2018-08-17 DIAGNOSIS — M6281 Muscle weakness (generalized): Secondary | ICD-10-CM | POA: Diagnosis not present

## 2018-08-17 DIAGNOSIS — R600 Localized edema: Secondary | ICD-10-CM | POA: Diagnosis not present

## 2018-08-17 DIAGNOSIS — R2689 Other abnormalities of gait and mobility: Secondary | ICD-10-CM | POA: Diagnosis not present

## 2018-08-17 DIAGNOSIS — M25561 Pain in right knee: Secondary | ICD-10-CM | POA: Diagnosis not present

## 2018-08-17 NOTE — Therapy (Signed)
[place], [place], [address] Phone: [phone]   Fax:  [phone]  Physical Therapy Treatment  Patient Details  Name: Tara Collins MRN: [id number] Date of Birth: 1934-10-09 Referring Provider (PT): Dr Carter Kitten   Encounter Date: 08/17/2018  PT End of Session - 08/17/18 0948    Visit Number  8    Number of Visits  18    Date for PT Re-Evaluation  09/06/18    PT Start Time  0845    PT Stop Time  0930    PT Time Calculation (min)  45 min    Activity Tolerance  Patient tolerated treatment well    Behavior During Therapy  [facility] for tasks assessed/performed       Past Medical History:  Diagnosis Date  . Arthritis   . Hepatitis B 03/1963  . Hypertension   . PONV (postoperative nausea and vomiting)   . Primary localized osteoarthritis of right knee 07/05/2018  . Seasonal allergies     Past Surgical History:  Procedure Laterality Date  . ABDOMINAL HYSTERECTOMY  01/1991  . APPENDECTOMY    . CATARACT EXTRACTION, BILATERAL    . CHOLECYSTECTOMY  10/2017  . PARTIAL KNEE ARTHROPLASTY Right 07/05/2018   Procedure: UNICOMPARTMENTAL KNEE;  Surgeon: Marchia Bond, MD;  Location: [facility];  Service: Orthopedics;  Laterality: Right;  . REPLACEMENT TOTAL KNEE Left 2012    There were no vitals filed for this visit.  Subjective Assessment - 08/17/18 0848    Subjective  Saw MD Monday and he said come back in a month.  Sometimes she has trouble sleeping.  She has been doing the exetrcises except longer walking .  She does not need the cane walking inside.      Currently in Pain?  No/denies    Pain Location  Knee    Pain Orientation  Right                       [facility] Adult PT Treatment/Exercise - 08/17/18 0001      High Level Balance   High Level Balance Comments  static and dynamic balance exercises at counter,  colse to contact guard. static and dynamic      Knee/Hip Exercises: Stretches    Passive Hamstring Stretch  Right;3 reps;30 seconds    Quad Stretch  Right;3 reps;30 seconds    Quad Stretch Limitations  patellar assist distel glides    Gastroc Stretch  Both;3 reps;30 seconds    Gastroc Stretch Limitations  incline      Knee/Hip Exercises: Aerobic   Nustep  L4 x 5 min UE/LE      Knee/Hip Exercises: Standing   Heel Raises  10 reps;Right    Heel Raises Limitations  HEP    Lateral Step Up  1 set;10 reps;Hand Hold: 1;Step Height: 4"    Lateral Step Up Limitations  cued hip position    Forward Step Up  1 set;10 reps;Hand Hold: 2;Step Height: 4"    Step Down  Right;1 set;Hand Hold: 1;Step Height: 4"    Step Down Limitations  cued     Functional Squat  10 reps;2 sets    Functional Squat Limitations  cued for increased weightbearing to right    SLS  20 seconde 1 X    Other Standing Knee Exercises  minisquat side steps,  focus on hip position    Other Standing Knee Exercises  single leg wall slides. SBA  Knee/Hip Exercises: Seated   Long Scientist, clinical (histocompatibility and immunogenetics)    Long Arc Quad Weight  5 lbs.    Long [employer] Limitations  10 second hold then eccentric lowering    Sit to Sand  5 reps   cued for hip position neutral,  yellow band used      Knee/Hip Exercises: Sidelying   Clams  1o X 2 sets.  mod assist  to decrease compensation.  right      Manual Therapy   Manual therapy comments  Retrograde soft tissue work right thigh.  tissue softened  pain reduced.              PT Education - 08/17/18 0947    Education Details  exercise form, HEP    Methods  Demonstration;Explanation;Tactile cues;Verbal cues;Handout    Comprehension  Verbalized understanding;Returned demonstration       PT Short Term Goals - 08/15/18 0809      PT SHORT TERM GOAL #1   Title  I with initial HEP ( 08/16/18)     Status  Achieved      PT SHORT TERM GOAL #2   Title  increase Rt knee motion -3 extension to 100 flexion ( 08/16/18)     Status  Achieved        PT Long Term Goals  - 08/15/18 0809      PT LONG TERM GOAL #1   Title  I with advanced HEP ( 09/06/18)     Status  On-going      PT LONG TERM GOAL #2   Title  increased Rt hip and knee strength =/> 5-/5 to allow gait in apartment without AD ( 09/06/18)     Baseline  all except Rt hip abduction, does demo some functional Rt quad weakness with step downs and squats    Status  Partially Met      PT LONG TERM GOAL #3   Title  improve FOTO =/< 44% limited ( 09/06/18)     Baseline  scored 47% limited today    Status  On-going      PT LONG TERM GOAL #4   Title  ambuate up down a flight of stairs with no difficulty using a railing to allow her to walk in her daughters house ( 09/06/18)     Status  Achieved      PT LONG TERM GOAL #5   Title  improve Rt knee ROM to within 5 degrees flexion of Lt ( 09/06/18)     Baseline  Rt knee is 10 degredd less from [place] - 08/17/18 0948    Clinical Impression Statement  SLS X 1 20 seconds.  Patient declined the need for modalities.  She works hard during session.    PT Next Visit Plan  cont strengthening and gait/balance    PT Home Exercise Plan  [facility] PT exercises.  Gait with cane. heel lift    Consulted and Agree with Plan of Care  Patient       Patient will benefit from skilled therapeutic intervention in order to improve the following deficits and impairments:     Visit Diagnosis: Stiffness of right knee, not elsewhere classified  Acute pain of right knee  Other abnormalities of gait and mobility  Muscle weakness (generalized)  Localized edema     Problem List Patient Active Problem List   Diagnosis Date Noted  . Primary  localized osteoarthritis of right knee 07/05/2018  . Status post right partial knee replacement 07/05/2018  . Dyslipidemia 06/20/2018  . ARTHRITIS, LEFT KNEE 11/06/2009  . DEPRESSION/ANXIETY 11/29/2008  . POSTMENOPAUSAL STATUS 11/29/2008  . HEADACHE, TENSION 02/03/2008  . LEG CRAMPS 02/03/2008  . MICROALBUMINURIA  07/27/2007  . HYPOKALEMIA 07/26/2007  . ACUTE CYSTITIS 07/26/2007  . DYSLIPIDEMIA 04/29/2007  . Essential hypertension 04/29/2007  . POSTPROCEDURAL STATE, CATARACT EXTRACTION 04/29/2007    HARRIS,KAREN PTA 08/17/2018, 9:54 AM  [facility] [address] [place], [place], [address] Phone: [phone]   Fax:  [phone]  Name: ARTELIA GAME MRN: [id number] Date of Birth: 04-23-1935

## 2018-08-19 ENCOUNTER — Ambulatory Visit: Payer: Medicare HMO | Admitting: Physical Therapy

## 2018-08-19 ENCOUNTER — Encounter: Payer: Self-pay | Admitting: Physical Therapy

## 2018-08-19 DIAGNOSIS — M25661 Stiffness of right knee, not elsewhere classified: Secondary | ICD-10-CM | POA: Diagnosis not present

## 2018-08-19 DIAGNOSIS — R2689 Other abnormalities of gait and mobility: Secondary | ICD-10-CM | POA: Diagnosis not present

## 2018-08-19 DIAGNOSIS — M6281 Muscle weakness (generalized): Secondary | ICD-10-CM | POA: Diagnosis not present

## 2018-08-19 DIAGNOSIS — R600 Localized edema: Secondary | ICD-10-CM

## 2018-08-19 DIAGNOSIS — M25561 Pain in right knee: Secondary | ICD-10-CM | POA: Diagnosis not present

## 2018-08-19 NOTE — Therapy (Addendum)
[place], [place], [address] Phone: [phone]   Fax:  [phone]  Physical Therapy Treatment Progress Note Reporting Period 08/19/2018 to 09/16/2018  See note below for Objective Data and Assessment of Progress/Goals.       Patient Details  Name: Tara Collins MRN: [id number] Date of Birth: 05-Nov-1934 Referring Provider (PT): Dr Carter Kitten   Encounter Date: 08/19/2018  PT End of Session - 08/19/18 0845    Visit Number  9    Number of Visits  18    Date for PT Re-Evaluation  09/16/18    Authorization Type  [employer]    PT Start Time  [id number]    PT Stop Time  0926    PT Time Calculation (min)  43 min    Activity Tolerance  Patient tolerated treatment well    Behavior During Therapy  [facility] for tasks assessed/performed       Past Medical History:  Diagnosis Date  . Arthritis   . Hepatitis B 03/1963  . Hypertension   . PONV (postoperative nausea and vomiting)   . Primary localized osteoarthritis of right knee 07/05/2018  . Seasonal allergies     Past Surgical History:  Procedure Laterality Date  . ABDOMINAL HYSTERECTOMY  01/1991  . APPENDECTOMY    . CATARACT EXTRACTION, BILATERAL    . CHOLECYSTECTOMY  10/2017  . PARTIAL KNEE ARTHROPLASTY Right 07/05/2018   Procedure: UNICOMPARTMENTAL KNEE;  Surgeon: Marchia Bond, MD;  Location: [facility];  Service: Orthopedics;  Laterality: Right;  . REPLACEMENT TOTAL KNEE Left 2012    There were no vitals filed for this visit.  Subjective Assessment - 08/19/18 0842    Subjective  Pt. reports knee more sore today/no specific exacerbating cause. No other new complaints/concerns this AM.    Currently in Pain?  Yes    Pain Score  5     Pain Location  Knee    Pain Orientation  Right    Pain Descriptors / Indicators  Aching    Pain Type  Surgical pain    Pain Onset  More than a month ago    Aggravating Factors   activity, sometimes no specific  aggs/eases    Pain Relieving Factors  ice, medication, gentle exercise    Effect of Pain on Daily Activities  limits walking and activity tolerance.         [facility] PT Assessment - 08/19/18 0001      AROM   Right/Left Knee  Right    Right Knee Extension  0    Right Knee Flexion  115      Strength   Right/Left Knee  Right;Left    Right Knee Flexion  5/5    Right Knee Extension  5/5    Left Knee Flexion  5/5    Left Knee Extension  5/5                   [facility] Adult PT Treatment/Exercise - 08/19/18 0001      Knee/Hip Exercises: Aerobic   Recumbent Bike  L1 x 5 min      Knee/Hip Exercises: Standing   Heel Raises  20 reps    Heel Raises Limitations  Airex    Hip Abduction  Stengthening;Both;1 set;10 reps    Abduction Limitations  2 lbs.    Lateral Step Up  Right;1 set;10 reps;Hand Hold: 1;Step Height: 6"    Lateral Step Up Limitations  cues for foot  position    Forward Step Up  Right;1 set;10 reps;Hand Hold: 1;Step Height: 6"    Forward Step Up Limitations  cues for foot position on step    Functional Squat  15 reps    Functional Squat Limitations  cues for avoiding knee flexion past toes    Rocker Board  1 minute      Knee/Hip Exercises: Seated   Long Arc Quad  Strengthening;Right;2 sets;10 reps    Long Arc Quad Weight  3 lbs.   lower weight today due to soreness   Hamstring Curl  Strengthening;Right;2 sets;10 reps    Hamstring Limitations  Green Theraband      Knee/Hip Exercises: Supine   Short Arc Quad Sets  Strengthening;Right;2 sets;10 reps    Short Arc Quad Sets Limitations  2 lbs.    Heel Slides  Right;2 sets;10 reps    Heel Slides Limitations  Therapist assist end-range flexion stretch    Bridges  Both;10 reps    Bridges Limitations  legs on bolster             PT Education - 08/19/18 [phone]    Education Details  HEP updates, POC    Person(s) Educated  Patient    Methods  Explanation;Demonstration;Verbal cues    Comprehension  Verbalized  understanding;Returned demonstration       PT Short Term Goals - 08/19/18 0854      PT SHORT TERM GOAL #1   Title  I with initial HEP ( 08/16/18)     Time  3    Period  Weeks    Status  Achieved      PT SHORT TERM GOAL #2   Title  increase Rt knee motion -3 extension to 100 flexion ( 08/16/18)     Time  3    Period  Weeks    Status  Achieved        PT Long Term Goals - 08/19/18 3143      PT LONG TERM GOAL #1   Title  I with advanced HEP     Baseline  updated today    Time  4    Status  Revised   time revised   Target Date  09/16/18      PT LONG TERM GOAL #2   Title  increased Rt hip and knee strength =/> 5-/5 to allow gait in apartment without AD     Baseline  all except Rt hip abduction, does demo some functional Rt quad weakness with step downs and squats    Time  4   time revised   Period  Weeks    Status  Revised    Target Date  09/16/18      PT LONG TERM GOAL #3   Title  improve FOTO =/< 44% limited     Baseline  47% limited    Time  4   time revised   Period  Weeks    Status  Revised      PT LONG TERM GOAL #4   Title  ambuate up down a flight of stairs with no difficulty using a railing to allow her to walk in her daughters house    Baseline  improving but still notes difficulty ascending>descending stairs    Time  4   time revised   Period  Weeks    Status  Not Met    Target Date  09/16/18      PT LONG TERM GOAL #5  Title  improve Rt knee ROM to within 5 degrees flexion of Lt     Baseline  0 deg-met    Time  6    Period  Weeks    Status  Achieved            Plan - 08/19/18 0945    Clinical Impression Statement  Pt. is progressing well now s/p right unicompartmental knee arthroplasty 07/05/18 with right knee ROM and strength gains and functional improvements for mobility status. Pt. able to ambulate without AD in apartment but still unable/uses cane for community ambulation (with PLOF independent ambulation without AD prior to significant  knee pain exacerbation before surgery). Good progress with strength but functionally still with quad weakness in closed kinetic chain activities impacting ability for activities such as stair navigation. Plan continur PT for further progress to address remaining functional limitations.    Clinical Presentation  Stable    Clinical Decision Making  Low    Rehab Potential  Excellent    PT Frequency  --   3x/week x 1 more week then 2x/week x 3 weeks   PT Duration  4 weeks    PT Treatment/Interventions  DME Instruction;Balance training;Scar mobilization;Passive range of motion;Neuromuscular re-education;Stair training;Moist Heat;Ultrasound;Functional mobility training;Manual techniques;Dry needling;Patient/family education;Therapeutic activities;Cryotherapy;Electrical Stimulation;Therapeutic exercise;Vasopneumatic Device    PT Next Visit Plan  cont strengthening and gait/balance    PT Home Exercise Plan  [facility] PT exercises.  Gait with cane. heel lift, squat at counter vs. chair, SLR, heel slide with sheet    Consulted and Agree with Plan of Care  Patient       Patient will benefit from skilled therapeutic intervention in order to improve the following deficits and impairments:  Abnormal gait, Pain, Decreased range of motion, Decreased strength, Difficulty walking, Decreased balance  Visit Diagnosis: Stiffness of right knee, not elsewhere classified  Acute pain of right knee  Other abnormalities of gait and mobility  Muscle weakness (generalized)  Localized edema     Problem List Patient Active Problem List   Diagnosis Date Noted  . Primary localized osteoarthritis of right knee 07/05/2018  . Status post right partial knee replacement 07/05/2018  . Dyslipidemia 06/20/2018  . ARTHRITIS, LEFT KNEE 11/06/2009  . DEPRESSION/ANXIETY 11/29/2008  . POSTMENOPAUSAL STATUS 11/29/2008  . HEADACHE, TENSION 02/03/2008  . LEG CRAMPS 02/03/2008  . MICROALBUMINURIA 07/27/2007  . HYPOKALEMIA  07/26/2007  . ACUTE CYSTITIS 07/26/2007  . DYSLIPIDEMIA 04/29/2007  . Essential hypertension 04/29/2007  . POSTPROCEDURAL STATE, CATARACT EXTRACTION 04/29/2007    Beaulah Dinning, PT, DPT 08/19/18 10:30 AM  [facility] [address] [place], [place], [address] Phone: [phone]   Fax:  [phone]  Name: KARLIN BINION MRN: [id number] Date of Birth: Mar 13, 1935

## 2018-08-22 ENCOUNTER — Ambulatory Visit: Payer: Medicare HMO | Admitting: Physical Therapy

## 2018-08-22 ENCOUNTER — Encounter: Payer: Self-pay | Admitting: Physical Therapy

## 2018-08-22 DIAGNOSIS — M6281 Muscle weakness (generalized): Secondary | ICD-10-CM | POA: Diagnosis not present

## 2018-08-22 DIAGNOSIS — M25661 Stiffness of right knee, not elsewhere classified: Secondary | ICD-10-CM | POA: Diagnosis not present

## 2018-08-22 DIAGNOSIS — R600 Localized edema: Secondary | ICD-10-CM

## 2018-08-22 DIAGNOSIS — R2689 Other abnormalities of gait and mobility: Secondary | ICD-10-CM | POA: Diagnosis not present

## 2018-08-22 DIAGNOSIS — M25561 Pain in right knee: Secondary | ICD-10-CM

## 2018-08-22 NOTE — Therapy (Signed)
[facility] [address] [place], [place], [address] Phone: [phone]   Fax:  [phone]  Physical Therapy Treatment  Patient Details  Name: Tara AsaLucy L Lantzy MRN: [id number] Date of Birth: 08-17-1934 Referring Provider (PT): Dr Dorthula NettlesJosh Landau   Encounter Date: 08/22/2018  PT End of Session - 08/22/18 0927    Visit Number  10    Number of Visits  18    Date for PT Re-Evaluation  09/16/18    Authorization Type  [employer]    PT Start Time  [phone]   short session patient late   PT Stop Time  0802    PT Time Calculation (min)  18 min    Activity Tolerance  Patient tolerated treatment well    Behavior During Therapy  [facility] for tasks assessed/performed       Past Medical History:  Diagnosis Date  . Arthritis   . Hepatitis B 03/1963  . Hypertension   . PONV (postoperative nausea and vomiting)   . Primary localized osteoarthritis of right knee 07/05/2018  . Seasonal allergies     Past Surgical History:  Procedure Laterality Date  . ABDOMINAL HYSTERECTOMY  01/1991  . APPENDECTOMY    . CATARACT EXTRACTION, BILATERAL    . CHOLECYSTECTOMY  10/2017  . PARTIAL KNEE ARTHROPLASTY Right 07/05/2018   Procedure: UNICOMPARTMENTAL KNEE;  Surgeon: Teryl LucyLandau, Joshua, MD;  Location: [facility];  Service: Orthopedics;  Laterality: Right;  . REPLACEMENT TOTAL KNEE Left 2012    There were no vitals filed for this visit.  Subjective Assessment - 08/22/18 0922    Subjective  Short sessoin,  Patient late.  Strength focus.    Currently in Pain?  No/denies    Pain Orientation  Right    Pain Descriptors / Indicators  Aching    Pain Frequency  Constant    Aggravating Factors   activity    Pain Relieving Factors  ice,  manual    Multiple Pain Sites  No                       [facility] Adult PT Treatment/Exercise - 08/22/18 0001      Knee/Hip Exercises: Standing   Heel Raises  Both;10 reps    Hip Abduction  Stengthening;Both;1 set;10  reps    Abduction Limitations  2 lbs.    Lateral Step Up  Right;1 set;10 reps;Hand Hold: 1;Step Height: 6"    Forward Step Up  Right;1 set;10 reps;Hand Hold: 1;Step Height: 6"    Step Down  Right    Step Down Limitations  10    Functional Squat  15 reps    Functional Squat Limitations  cues for avoiding knee flexion past toes    Rocker Board  1 minute      Knee/Hip Exercises: Seated   Hamstring Curl  Strengthening;Right;Left;10 reps;2 sets    Hamstring Limitations  green band ,  HEP    Sit to [employer]Sand  10 reps      Knee/Hip Exercises: Supine   Quad Sets  10 reps    Heel Slides  10 reps      Knee/Hip Exercises: Sidelying   Hip ABduction  10 reps    Hip ABduction Limitations  hip pain             PT Education - 08/22/18 0926    Education Details  HEP    Person(s) Educated  Patient    Methods  Explanation;Demonstration;Tactile cues;Verbal cues;Handout  Comprehension  Returned demonstration;Verbalized understanding       PT Short Term Goals - 08/19/18 0854      PT SHORT TERM GOAL #1   Title  I with initial HEP ( 08/16/18)     Time  3    Period  Weeks    Status  Achieved      PT SHORT TERM GOAL #2   Title  increase Rt knee motion -3 extension to 100 flexion ( 08/16/18)     Time  3    Period  Weeks    Status  Achieved        PT Long Term Goals - 08/22/18 1032      PT LONG TERM GOAL #1   Title  I with advanced HEP     Time  4    Period  Weeks    Status  On-going      PT LONG TERM GOAL #2   Title  increased Rt hip and knee strength =/> 5-/5 to allow gait in apartment without AD     Baseline  addressing with exercise    Time  4    Period  Weeks    Status  On-going      PT LONG TERM GOAL #3   Title  improve FOTO =/< 44% limited     Time  4    Period  Weeks    Status  Unable to assess      PT LONG TERM GOAL #4   Title  ambuate up down a flight of stairs with no difficulty using a railing to allow her to walk in her daughters house    Time  4    Period   Weeks    Status  Unable to assess      PT LONG TERM GOAL #5   Title  improve Rt knee ROM to within 5 degrees flexion of Lt     Time  6    Period  Weeks    Status  Achieved            Plan - 08/22/18 1029    Clinical Impression Statement  Patient had a short session today due to being late. She continues to work hard during session.  closed kinetic chain exercises are challanging, HEP added new closed kinetic chain exercise today. Patient declined the need for modalities for pain.     PT Next Visit Plan  cont strengthening and gait/balance  Needs FOTO next visit.      PT Home Exercise Plan  [facility] PT exercises.  Gait with cane. heel lift, squat at counter vs. chair, SLR, heel slide with sheet    Consulted and Agree with Plan of Care  Patient       Patient will benefit from skilled therapeutic intervention in order to improve the following deficits and impairments:     Visit Diagnosis: Stiffness of right knee, not elsewhere classified  Acute pain of right knee  Other abnormalities of gait and mobility  Muscle weakness (generalized)  Localized edema     Problem List Patient Active Problem List   Diagnosis Date Noted  . Primary localized osteoarthritis of right knee 07/05/2018  . Status post right partial knee replacement 07/05/2018  . Dyslipidemia 06/20/2018  . ARTHRITIS, LEFT KNEE 11/06/2009  . DEPRESSION/ANXIETY 11/29/2008  . POSTMENOPAUSAL STATUS 11/29/2008  . HEADACHE, TENSION 02/03/2008  . LEG CRAMPS 02/03/2008  . MICROALBUMINURIA 07/27/2007  . HYPOKALEMIA 07/26/2007  . ACUTE CYSTITIS 07/26/2007  .  DYSLIPIDEMIA 04/29/2007  . Essential hypertension 04/29/2007  . POSTPROCEDURAL STATE, CATARACT EXTRACTION 04/29/2007    ,  PTA 08/22/2018, 10:34 AM  [facility] [address] [place], [place], [address] Phone: [phone]   Fax:  [phone]  Name: RAEYA APOLLO MRN: [id number] Date of Birth:  1935-06-01

## 2018-08-22 NOTE — Patient Instructions (Signed)
FLEXION: Sitting - Resistance Band (Active)    Sit with right leg extended. Against yellow resistance band, bend knee and draw foot backward. Complete __1 to 2_ sets of 10___ repetitions. Perform _1__ sessions per day.  http://gtsc.exer.us/231   Copyright  VHI. All rights reserved.

## 2018-08-24 ENCOUNTER — Ambulatory Visit: Payer: Medicare HMO | Admitting: Physical Therapy

## 2018-08-24 ENCOUNTER — Encounter: Payer: Self-pay | Admitting: Physical Therapy

## 2018-08-24 DIAGNOSIS — M6281 Muscle weakness (generalized): Secondary | ICD-10-CM | POA: Diagnosis not present

## 2018-08-24 DIAGNOSIS — R600 Localized edema: Secondary | ICD-10-CM | POA: Diagnosis not present

## 2018-08-24 DIAGNOSIS — M25561 Pain in right knee: Secondary | ICD-10-CM

## 2018-08-24 DIAGNOSIS — M25661 Stiffness of right knee, not elsewhere classified: Secondary | ICD-10-CM

## 2018-08-24 DIAGNOSIS — R2689 Other abnormalities of gait and mobility: Secondary | ICD-10-CM

## 2018-08-24 NOTE — Therapy (Signed)
[place], [place], [address] Phone: [phone]   Fax:  [phone]  Physical Therapy Treatment  Patient Details  Name: Tara Collins MRN: [id number] Date of Birth: 12/16/1934 Referring Provider (PT): Dr Carter Kitten   Encounter Date: 08/24/2018  PT End of Session - 08/24/18 0848    Visit Number  11    Number of Visits  18    Date for PT Re-Evaluation  09/16/18    Authorization Type  [employer]    PT Start Time  0849    PT Stop Time  0937    PT Time Calculation (min)  48 min    Activity Tolerance  Patient tolerated treatment well       Past Medical History:  Diagnosis Date  . Arthritis   . Hepatitis B 03/1963  . Hypertension   . PONV (postoperative nausea and vomiting)   . Primary localized osteoarthritis of right knee 07/05/2018  . Seasonal allergies     Past Surgical History:  Procedure Laterality Date  . ABDOMINAL HYSTERECTOMY  01/1991  . APPENDECTOMY    . CATARACT EXTRACTION, BILATERAL    . CHOLECYSTECTOMY  10/2017  . PARTIAL KNEE ARTHROPLASTY Right 07/05/2018   Procedure: UNICOMPARTMENTAL KNEE;  Surgeon: Marchia Bond, MD;  Location: [facility];  Service: Orthopedics;  Laterality: Right;  . REPLACEMENT TOTAL KNEE Left 2012    There were no vitals filed for this visit.  Subjective Assessment - 08/24/18 0849    Subjective  Pt rerpots she is doing well today, working on weaning off the cane, she was able to get in/out of tub this weekend, she did have pain however was able to do it.     Patient Stated Goals  get to walk normal - not need a cane    Currently in Pain?  Yes    Pain Score  2     Pain Location  Knee    Pain Orientation  Right;Medial    Pain Descriptors / Indicators  Dull    Pain Type  Surgical pain    Pain Onset  More than a month ago    Pain Frequency  Intermittent    Aggravating Factors   activity    Pain Relieving Factors  ice         [facility] PT Assessment -  08/24/18 0001      Assessment   Medical Diagnosis  Rt uni TKA    Referring Provider (PT)  Dr Carter Kitten    Next MD Visit  09/12/2018                   [facility] Adult PT Treatment/Exercise - 08/24/18 0001      Ambulation/Gait   Stairs  Yes    Stairs Assistance  7: Independent    Stair Management Technique  No rails;Alternating pattern    Number of Stairs  --   4 reps stairs inthe clinic     Knee/Hip Exercises: Stretches   Active Hamstring Stretch  Right   supine with strap, 3x15 knee presses   Quad Stretch  Right;60 seconds;2 reps   supine with strap   Hip Flexor Stretch  Right   using leg lengtheners   Other Knee/Hip Stretches  Rt hip figure 4 stretching      Knee/Hip Exercises: Aerobic   Nustep  L3x5' U/LE      Knee/Hip Exercises: Standing   Side Lunges  Right;2 sets   8 reps curtsey  lunges   Lateral Step Up  Right;20 reps;Step Height: 8"   no UE assist   Forward Step Up  Right;20 reps;Step Height: 8"   no UE assist   Step Down  Right;Step Height: 8";20 reps   used 6" step initially, to easy   Step Down Limitations  single UE assist      Knee/Hip Exercises: Supine   Short Arc Quad Sets  Strengthening;Right;3 sets;10 reps   with hip adduction, squeezing ball   Short Arc Quad Sets Limitations  5lbs    Straight Leg Raise with External Rotation  Strengthening;Right;15 reps;3 sets      Knee/Hip Exercises: Sidelying   Other Sidelying Knee/Hip Exercises  15 reps each Rt LE FWD/BWD kicks, CW/CCW circles and FWD BWD taps                PT Short Term Goals - 08/24/18 2683      PT SHORT TERM GOAL #1   Title  I with initial HEP ( 08/16/18)     Status  Achieved      PT SHORT TERM GOAL #2   Title  increase Rt knee motion -3 extension to 100 flexion ( 08/16/18)     Status  Achieved        PT Long Term Goals - 08/24/18 0855      PT LONG TERM GOAL #1   Title  I with advanced HEP     Status  On-going      PT LONG TERM GOAL #2   Title  increased Rt  hip and knee strength =/> 5-/5 to allow gait in apartment without AD     Status  Partially Met      PT LONG TERM GOAL #3   Title  improve FOTO =/< 44% limited     Status  On-going      PT LONG TERM GOAL #4   Title  ambuate up down a flight of stairs with no difficulty using a railing to allow her to walk in her daughters house    Status  Achieved      PT LONG TERM GOAL #5   Title  improve Rt knee ROM to within 5 degrees flexion of Lt     Status  Achieved            Plan - 08/24/18 0934    Clinical Impression Statement  Tara Collins is doing very well with her therapy.  Her LE strength and Rt knee ROM continue to improve.  She is on track to meet all her goals before her POC is up so she may be able to finish up early.  Biggest limitating factor for now is high level balance    Rehab Potential  Excellent    PT Duration  4 weeks    PT Treatment/Interventions  DME Instruction;Balance training;Scar mobilization;Passive range of motion;Neuromuscular re-education;Stair training;Moist Heat;Ultrasound;Functional mobility training;Manual techniques;Dry needling;Patient/family education;Therapeutic activities;Cryotherapy;Electrical Stimulation;Therapeutic exercise;Vasopneumatic Device    PT Next Visit Plan  advanced balance and FOTO     Consulted and Agree with Plan of Care  Patient       Patient will benefit from skilled therapeutic intervention in order to improve the following deficits and impairments:  Abnormal gait, Pain, Decreased range of motion, Decreased strength, Difficulty walking, Decreased balance  Visit Diagnosis: Stiffness of right knee, not elsewhere classified  Acute pain of right knee  Other abnormalities of gait and mobility  Muscle weakness (generalized)  Localized edema  Problem List Patient Active Problem List   Diagnosis Date Noted  . Primary localized osteoarthritis of right knee 07/05/2018  . Status post right partial knee replacement 07/05/2018  .  Dyslipidemia 06/20/2018  . ARTHRITIS, LEFT KNEE 11/06/2009  . DEPRESSION/ANXIETY 11/29/2008  . POSTMENOPAUSAL STATUS 11/29/2008  . HEADACHE, TENSION 02/03/2008  . LEG CRAMPS 02/03/2008  . MICROALBUMINURIA 07/27/2007  . HYPOKALEMIA 07/26/2007  . ACUTE CYSTITIS 07/26/2007  . DYSLIPIDEMIA 04/29/2007  . Essential hypertension 04/29/2007  . POSTPROCEDURAL STATE, CATARACT EXTRACTION 04/29/2007    Jeral Pinch PT  08/24/2018, 9:41 AM  [facility] [address] [place], [place], [address] Phone: [phone]   Fax:  [phone]  Name: Tara Collins MRN: [id number] Date of Birth: 20-Sep-1934

## 2018-08-26 ENCOUNTER — Ambulatory Visit: Payer: Medicare HMO | Admitting: Physical Therapy

## 2018-08-26 ENCOUNTER — Encounter: Payer: Self-pay | Admitting: Physical Therapy

## 2018-08-26 DIAGNOSIS — M6281 Muscle weakness (generalized): Secondary | ICD-10-CM

## 2018-08-26 DIAGNOSIS — M25661 Stiffness of right knee, not elsewhere classified: Secondary | ICD-10-CM

## 2018-08-26 DIAGNOSIS — R600 Localized edema: Secondary | ICD-10-CM | POA: Diagnosis not present

## 2018-08-26 DIAGNOSIS — R2689 Other abnormalities of gait and mobility: Secondary | ICD-10-CM | POA: Diagnosis not present

## 2018-08-26 DIAGNOSIS — M25561 Pain in right knee: Secondary | ICD-10-CM

## 2018-08-26 NOTE — Therapy (Signed)
[place], [place], [address] Phone: [phone]   Fax:  [phone]  Physical Therapy Treatment  Patient Details  Name: Tara Collins MRN: [id number] Date of Birth: 09-26-1934 Referring Provider (PT): Dr Carter Kitten   Encounter Date: 08/26/2018  PT End of Session - 08/26/18 0827    Visit Number  12    Number of Visits  18    Date for PT Re-Evaluation  09/16/18    Authorization Type  [employer]    PT Start Time  0752    PT Stop Time  0831    PT Time Calculation (min)  39 min    Activity Tolerance  Patient limited by fatigue;Patient tolerated treatment well   bike limited by fatigue   Behavior During Therapy  [facility] for tasks assessed/performed       Past Medical History:  Diagnosis Date  . Arthritis   . Hepatitis B 03/1963  . Hypertension   . PONV (postoperative nausea and vomiting)   . Primary localized osteoarthritis of right knee 07/05/2018  . Seasonal allergies     Past Surgical History:  Procedure Laterality Date  . ABDOMINAL HYSTERECTOMY  01/1991  . APPENDECTOMY    . CATARACT EXTRACTION, BILATERAL    . CHOLECYSTECTOMY  10/2017  . PARTIAL KNEE ARTHROPLASTY Right 07/05/2018   Procedure: UNICOMPARTMENTAL KNEE;  Surgeon: Marchia Bond, MD;  Location: [facility];  Service: Orthopedics;  Laterality: Right;  . REPLACEMENT TOTAL KNEE Left 2012    There were no vitals filed for this visit.  Subjective Assessment - 08/26/18 0753    Subjective  Pt. arrives without cane today. Mild soreness this AM but otherwise no new complaints or concerns.                       [facility] Adult PT Treatment/Exercise - 08/26/18 0001      Knee/Hip Exercises: Stretches   Gastroc Stretch  Both;3 reps;20 seconds    Gastroc Stretch Limitations  Slant board stretch      Knee/Hip Exercises: Aerobic   Recumbent Bike  L1 x 4 min   4 min instead of 5 due to fatigue     Knee/Hip Exercises: Standing    Heel Raises  Both;2 sets;10 reps    Heel Raises Limitations  [employer]    Side Lunges  Right;10 reps    Side Lunges Limitations  side partial split squat, mod visual/verbal cues for form    Lateral Step Up  Right;2 sets;10 reps;Hand Hold: 1;Step Height: 8"    Forward Step Up  Right;2 sets;10 reps;Hand Hold: 1;Step Height: 8"    Step Down  Right;1 set;10 reps;Hand Hold: 1;Step Height: 8"    Step Down Limitations  right foot on step with left stepdown    Functional Squat Limitations  Box squat to wedge on low table 2x10    Rocker Board  1 minute      Knee/Hip Exercises: Seated   Long Arc Quad  Right;2 sets;10 reps      Knee/Hip Exercises: Supine   Short Arc [employer]  --   with ball add. squeeze   Short Arc Quad Sets Limitations  5 lbs.    Heel Slides  Right;2 sets;10 reps   heel slide with overpressure 2x10   Bridges  Both;2 sets;10 reps             PT Education - 08/26/18 0827    Education Details  POC  Person(s) Educated  Patient    Methods  Explanation    Comprehension  Verbalized understanding       PT Short Term Goals - 08/24/18 0855      PT SHORT TERM GOAL #1   Title  I with initial HEP ( 08/16/18)     Status  Achieved      PT SHORT TERM GOAL #2   Title  increase Rt knee motion -3 extension to 100 flexion ( 08/16/18)     Status  Achieved        PT Long Term Goals - 08/24/18 0855      PT LONG TERM GOAL #1   Title  I with advanced HEP     Status  On-going      PT LONG TERM GOAL #2   Title  increased Rt hip and knee strength =/> 5-/5 to allow gait in apartment without AD     Status  Partially Met      PT LONG TERM GOAL #3   Title  improve FOTO =/< 44% limited     Status  On-going      PT LONG TERM GOAL #4   Title  ambuate up down a flight of stairs with no difficulty using a railing to allow her to walk in her daughters house    Status  Achieved      PT LONG TERM GOAL #5   Title  improve Rt knee ROM to within 5 degrees flexion of Lt     Status   Achieved            Plan - 08/26/18 8588    Clinical Impression Statement  Progressing well with closed chain strengthening activities and knee ROM continues to progress as well. Still some challenge with high level balance but progressing for gait goal with decreased need AD.    Rehab Potential  Excellent    PT Frequency  2x / week    PT Duration  3 weeks    PT Treatment/Interventions  DME Instruction;Balance training;Scar mobilization;Passive range of motion;Neuromuscular re-education;Stair training;Moist Heat;Ultrasound;Functional mobility training;Manual techniques;Dry needling;Patient/family education;Therapeutic activities;Cryotherapy;Electrical Stimulation;Therapeutic exercise;Vasopneumatic Device    PT Next Visit Plan  advanced balance and FOTO     PT Home Exercise Plan  [facility] PT exercises.  Gait with cane. heel lift, squat at counter vs. chair, SLR, heel slide with sheet    Consulted and Agree with Plan of Care  Patient       Patient will benefit from skilled therapeutic intervention in order to improve the following deficits and impairments:  Abnormal gait, Pain, Decreased range of motion, Decreased strength, Difficulty walking, Decreased balance  Visit Diagnosis: Stiffness of right knee, not elsewhere classified  Acute pain of right knee  Other abnormalities of gait and mobility  Muscle weakness (generalized)  Localized edema     Problem List Patient Active Problem List   Diagnosis Date Noted  . Primary localized osteoarthritis of right knee 07/05/2018  . Status post right partial knee replacement 07/05/2018  . Dyslipidemia 06/20/2018  . ARTHRITIS, LEFT KNEE 11/06/2009  . DEPRESSION/ANXIETY 11/29/2008  . POSTMENOPAUSAL STATUS 11/29/2008  . HEADACHE, TENSION 02/03/2008  . LEG CRAMPS 02/03/2008  . MICROALBUMINURIA 07/27/2007  . HYPOKALEMIA 07/26/2007  . ACUTE CYSTITIS 07/26/2007  . DYSLIPIDEMIA 04/29/2007  . Essential hypertension 04/29/2007  .  POSTPROCEDURAL STATE, CATARACT EXTRACTION 04/29/2007    Beaulah Dinning, PT, DPT 08/26/18 8:36 AM  [facility] [place],  [place], [address] Phone: [phone]   Fax:  [phone]  Name: Tara Collins MRN: [id number] Date of Birth: [birth year]

## 2018-08-29 ENCOUNTER — Encounter: Payer: Self-pay | Admitting: Physical Therapy

## 2018-08-29 ENCOUNTER — Ambulatory Visit: Payer: Medicare HMO | Attending: Orthopedic Surgery | Admitting: Physical Therapy

## 2018-08-29 DIAGNOSIS — R2689 Other abnormalities of gait and mobility: Secondary | ICD-10-CM | POA: Diagnosis not present

## 2018-08-29 DIAGNOSIS — M25661 Stiffness of right knee, not elsewhere classified: Secondary | ICD-10-CM | POA: Diagnosis not present

## 2018-08-29 DIAGNOSIS — R600 Localized edema: Secondary | ICD-10-CM | POA: Diagnosis not present

## 2018-08-29 DIAGNOSIS — M25561 Pain in right knee: Secondary | ICD-10-CM | POA: Insufficient documentation

## 2018-08-29 DIAGNOSIS — M6281 Muscle weakness (generalized): Secondary | ICD-10-CM | POA: Insufficient documentation

## 2018-08-29 NOTE — Therapy (Signed)
[place], [place], [address] Phone: [phone]   Fax:  [phone]  Physical Therapy Treatment/Discharge Summary  Patient Details  Name: Tara Collins MRN: [id number] Date of Birth: [birth year] Referring Provider (PT): Dr Carter Kitten   Encounter Date: 08/29/2018  PT End of Session - 08/29/18 0931    Visit Number  13    Number of Visits  18    Date for PT Re-Evaluation  09/16/18    Authorization Type  [employer]    PT Start Time  0918    PT Stop Time  0959    PT Time Calculation (min)  41 min    Activity Tolerance  Patient tolerated treatment well    Behavior During Therapy  [facility] for tasks assessed/performed       Past Medical History:  Diagnosis Date  . Arthritis   . Hepatitis B 03/1963  . Hypertension   . PONV (postoperative nausea and vomiting)   . Primary localized osteoarthritis of right knee 07/05/2018  . Seasonal allergies     Past Surgical History:  Procedure Laterality Date  . ABDOMINAL HYSTERECTOMY  01/1991  . APPENDECTOMY    . CATARACT EXTRACTION, BILATERAL    . CHOLECYSTECTOMY  10/2017  . PARTIAL KNEE ARTHROPLASTY Right 07/05/2018   Procedure: UNICOMPARTMENTAL KNEE;  Surgeon: Marchia Bond, MD;  Location: [facility];  Service: Orthopedics;  Laterality: Right;  . REPLACEMENT TOTAL KNEE Left 2012    There were no vitals filed for this visit.  Subjective Assessment - 08/29/18 0920    Subjective  Pt. reports ambulating with cane to church otherwise no longer using AD. No pain pre-treatment. Pt. reports she feels ready to be done with therapy today. No new complaints/concerns otherwise.    Diagnostic tests  X-rays    Patient Stated Goals  get to walk normal - not need a cane    Currently in Pain?  No/denies         [facility] PT Assessment - 08/29/18 0001      Observation/Other Assessments   Focus on Therapeutic Outcomes (FOTO)   40% limited      AROM   Right Knee Extension  0     Right Knee Flexion  120      Strength   Overall Strength Comments  R hip seated MMTs grossly 5/5    Right Hip Flexion  --    Right Hip External Rotation   --    Right Hip Internal Rotation  --    Right Knee Flexion  5/5    Right Knee Extension  5/5    Left Knee Flexion  --    Left Knee Extension  --                   [facility] Adult PT Treatment/Exercise - 08/29/18 0001      Knee/Hip Exercises: Aerobic   Nustep  L5 x 5 min UE/LE      Knee/Hip Exercises: Standing   Heel Raises  Both;20 reps    Heel Raises Limitations  Airex    Lateral Step Up  Right;2 sets;10 reps;Hand Hold: 1;Step Height: 8"    Forward Step Up  Right;2 sets;10 reps;Hand Hold: 1;Step Height: 8"    Functional Squat Limitations  Counter squat 2x10    Rocker Board  1 minute      Knee/Hip Exercises: Seated   Long Arc Quad  Right;3 sets;10 reps    Long Arc Quad Weight  --  Green Theraband     Knee/Hip Exercises: Supine   Short Arc Quad Sets  Strengthening;Right;3 sets;10 reps    Heel Slides  Right;2 sets;10 reps    Heel Slides Limitations  Therapist OP at end-range    Bridges  Both;2 sets;10 reps    Bridges Limitations  Legs on bolster    Straight Leg Raises  Strengthening;Right;2 sets;15 reps             PT Education - 08/29/18 0931    Education Details  POC, HEP    Person(s) Educated  Patient    Methods  Explanation;Demonstration;Handout;Verbal cues    Comprehension  Verbalized understanding;Returned demonstration       PT Short Term Goals - 08/29/18 0934      PT SHORT TERM GOAL #1   Title  I with initial HEP ( 08/16/18)     Time  3    Period  Weeks    Status  Achieved      PT SHORT TERM GOAL #2   Title  increase Rt knee motion -3 extension to 100 flexion ( 08/16/18)     Time  3    Period  Weeks    Status  Achieved        PT Long Term Goals - 08/29/18 0935      PT LONG TERM GOAL #1   Title  I with advanced HEP     Baseline  met    Time  4    Period  Weeks    Status   Achieved      PT LONG TERM GOAL #2   Title  increased Rt hip and knee strength =/> 5-/5 to allow gait in apartment without AD     Baseline  5/5, met    Time  4    Period  Weeks    Status  Achieved      PT LONG TERM GOAL #3   Title  improve FOTO =/< 44% limited     Baseline  40% limited    Time  4    Period  Weeks    Status  Achieved      PT LONG TERM GOAL #4   Title  ambuate up down a flight of stairs with no difficulty using a railing to allow her to walk in her daughters house    Baseline  met, reports no difficulty    Time  4    Period  Weeks    Status  Achieved      PT LONG TERM GOAL #5   Title  improve Rt knee ROM to within 5 degrees flexion of Lt     Baseline  0-120 deg    Time  6    Period  Weeks    Status  Achieved            Plan - 08/29/18 0932    Clinical Impression Statement  Pt.. has made excellent progress with therapy with right knee and ROM gains and functional status for mobility. Good progress since last report with further knee ROM gains as well as improvements in knee strength for closed-kinetic chain activities. All therapy goals now met and pt. in agreement with plans to d/c to HEP today. No formal therapy needed at this time.    Clinical Presentation  Stable    Clinical Decision Making  Low    Rehab Potential  Excellent    PT Frequency  2x / week    PT  Duration  3 weeks    PT Treatment/Interventions  DME Instruction;Balance training;Scar mobilization;Passive range of motion;Neuromuscular re-education;Stair training;Moist Heat;Ultrasound;Functional mobility training;Manual techniques;Dry needling;Patient/family education;Therapeutic activities;Cryotherapy;Electrical Stimulation;Therapeutic exercise;Vasopneumatic Device    PT Next Visit Plan  NA    PT Home Exercise Plan  see handout    Consulted and Agree with Plan of Care  Patient       Patient will benefit from skilled therapeutic intervention in order to improve the following deficits and  impairments:  Abnormal gait, Pain, Decreased range of motion, Decreased strength, Difficulty walking, Decreased balance  Visit Diagnosis: Stiffness of right knee, not elsewhere classified  Acute pain of right knee  Other abnormalities of gait and mobility  Muscle weakness (generalized)  Localized edema     Problem List Patient Active Problem List   Diagnosis Date Noted  . Primary localized osteoarthritis of right knee 07/05/2018  . Status post right partial knee replacement 07/05/2018  . Dyslipidemia 06/20/2018  . ARTHRITIS, LEFT KNEE 11/06/2009  . DEPRESSION/ANXIETY 11/29/2008  . POSTMENOPAUSAL STATUS 11/29/2008  . HEADACHE, TENSION 02/03/2008  . LEG CRAMPS 02/03/2008  . MICROALBUMINURIA 07/27/2007  . HYPOKALEMIA 07/26/2007  . ACUTE CYSTITIS 07/26/2007  . DYSLIPIDEMIA 04/29/2007  . Essential hypertension 04/29/2007  . POSTPROCEDURAL STATE, CATARACT EXTRACTION 04/29/2007      PHYSICAL THERAPY DISCHARGE SUMMARY  Visits from Start of Care: 13  Current functional level related to goals / functional outcomes: Therapy goals met. Pt. Doing well with functional status for gait, mobility and stairs s/p right unicompartmental TKA.   Remaining deficits: NA   Education / Equipment: HEP Plan: Patient agrees to discharge.  Patient goals were met. Patient is being discharged due to meeting the stated rehab goals.  ?????           Beaulah Dinning, PT, DPT 08/29/18 10:06 AM    [facility] [address] [place], [place], [address] Phone: [phone]   Fax:  [phone]  Name: Tara Collins MRN: [id number] Date of Birth: July 31, 1934

## 2018-09-02 ENCOUNTER — Ambulatory Visit: Payer: Medicare HMO | Admitting: Physical Therapy

## 2018-09-05 ENCOUNTER — Ambulatory Visit: Payer: Medicare HMO | Admitting: Physical Therapy

## 2018-09-09 ENCOUNTER — Ambulatory Visit: Payer: Medicare HMO | Admitting: Physical Therapy

## 2018-09-12 ENCOUNTER — Encounter: Payer: Medicaid Other | Admitting: Physical Therapy

## 2018-09-12 DIAGNOSIS — M1711 Unilateral primary osteoarthritis, right knee: Secondary | ICD-10-CM | POA: Diagnosis not present

## 2018-09-16 ENCOUNTER — Encounter: Payer: Medicaid Other | Admitting: Physical Therapy

## 2018-09-26 DIAGNOSIS — I10 Essential (primary) hypertension: Secondary | ICD-10-CM | POA: Diagnosis not present

## 2018-09-26 DIAGNOSIS — E785 Hyperlipidemia, unspecified: Secondary | ICD-10-CM | POA: Diagnosis not present

## 2018-09-26 DIAGNOSIS — M1990 Unspecified osteoarthritis, unspecified site: Secondary | ICD-10-CM | POA: Diagnosis not present

## 2018-11-11 ENCOUNTER — Other Ambulatory Visit: Payer: Self-pay | Admitting: Nurse Practitioner

## 2018-11-11 NOTE — Telephone Encounter (Signed)
Trazodone refill 

## 2018-12-21 ENCOUNTER — Other Ambulatory Visit: Payer: Self-pay | Admitting: Nurse Practitioner

## 2018-12-21 DIAGNOSIS — I10 Essential (primary) hypertension: Secondary | ICD-10-CM

## 2018-12-22 ENCOUNTER — Encounter: Payer: Self-pay | Admitting: Internal Medicine

## 2018-12-22 ENCOUNTER — Ambulatory Visit: Payer: Self-pay

## 2019-01-03 ENCOUNTER — Telehealth: Payer: Self-pay

## 2019-01-03 ENCOUNTER — Encounter: Payer: Self-pay | Admitting: Nurse Practitioner

## 2019-01-03 ENCOUNTER — Ambulatory Visit: Payer: Self-pay

## 2019-01-03 NOTE — Telephone Encounter (Signed)
This nurse attempted to call patient in regards to missing today's appointment. Message was left for patient to call back to reschedule.

## 2019-01-16 ENCOUNTER — Other Ambulatory Visit: Payer: Self-pay | Admitting: Nurse Practitioner

## 2019-02-19 ENCOUNTER — Other Ambulatory Visit: Payer: Self-pay | Admitting: Nurse Practitioner

## 2019-03-16 ENCOUNTER — Other Ambulatory Visit: Payer: Self-pay

## 2019-03-16 ENCOUNTER — Ambulatory Visit (INDEPENDENT_AMBULATORY_CARE_PROVIDER_SITE_OTHER): Payer: Medicare HMO | Admitting: Nurse Practitioner

## 2019-03-16 ENCOUNTER — Ambulatory Visit (INDEPENDENT_AMBULATORY_CARE_PROVIDER_SITE_OTHER): Payer: Medicare HMO

## 2019-03-16 VITALS — BP 128/64 | HR 76 | Temp 98.4°F | Ht 61.8 in | Wt 154.0 lb

## 2019-03-16 DIAGNOSIS — Z0000 Encounter for general adult medical examination without abnormal findings: Secondary | ICD-10-CM

## 2019-03-16 DIAGNOSIS — R8290 Unspecified abnormal findings in urine: Secondary | ICD-10-CM

## 2019-03-16 DIAGNOSIS — I10 Essential (primary) hypertension: Secondary | ICD-10-CM

## 2019-03-16 DIAGNOSIS — G4700 Insomnia, unspecified: Secondary | ICD-10-CM

## 2019-03-16 DIAGNOSIS — E785 Hyperlipidemia, unspecified: Secondary | ICD-10-CM

## 2019-03-16 DIAGNOSIS — E876 Hypokalemia: Secondary | ICD-10-CM

## 2019-03-16 LAB — POCT URINALYSIS DIPSTICK
Bilirubin, UA: NEGATIVE
Glucose, UA: NEGATIVE
Ketones, UA: NEGATIVE
Nitrite, UA: NEGATIVE
Protein, UA: POSITIVE — AB
Spec Grav, UA: 1.02 (ref 1.010–1.025)
Urobilinogen, UA: 0.2 E.U./dL
pH, UA: 6.5 (ref 5.0–8.0)

## 2019-03-16 LAB — BMP8+ANION GAP
Anion Gap: 16 mmol/L (ref 10.0–18.0)
BUN/Creatinine Ratio: 16 (ref 12–28)
BUN: 14 mg/dL (ref 8–27)
CO2: 24 mmol/L (ref 20–29)
Calcium: 9.6 mg/dL (ref 8.7–10.3)
Chloride: 105 mmol/L (ref 96–106)
Creatinine, Ser: 0.87 mg/dL (ref 0.57–1.00)
GFR calc Af Amer: 72 mL/min/{1.73_m2} (ref >59)
GFR calc non Af Amer: 62 mL/min/{1.73_m2} (ref >59)
Glucose: 94 mg/dL (ref 65–99)
Potassium: 3.4 mmol/L — ABNORMAL LOW (ref 3.5–5.2)
Sodium: 145 mmol/L — ABNORMAL HIGH (ref 134–144)

## 2019-03-16 LAB — CBC
Hematocrit: 38.6 % (ref 34.0–46.6)
Hemoglobin: 13.4 g/dL (ref 11.1–15.9)
MCH: 33.6 pg — ABNORMAL HIGH (ref 26.6–33.0)
MCHC: 34.7 g/dL (ref 31.5–35.7)
MCV: 97 fL (ref 79–97)
Platelets: 250 10*3/uL (ref 150–450)
RBC: 3.99 x10E6/uL (ref 3.77–5.28)
RDW: 11.9 % (ref 11.7–15.4)
WBC: 5.8 10*3/uL (ref 3.4–10.8)

## 2019-03-16 LAB — LIPID PANEL
Chol/HDL Ratio: 3.7 ratio (ref 0.0–4.4)
Cholesterol, Total: 187 mg/dL (ref 100–199)
HDL: 50 mg/dL (ref >39)
LDL Calculated: 108 mg/dL — ABNORMAL HIGH (ref 0–99)
Triglycerides: 144 mg/dL (ref 0–149)
VLDL Cholesterol Cal: 29 mg/dL (ref 5–40)

## 2019-03-16 MED ORDER — LOSARTAN POTASSIUM 100 MG PO TABS: 100.0000 mg | ORAL_TABLET | Freq: Every day | ORAL | 1 refills | Status: DC

## 2019-03-16 MED ORDER — TRAZODONE HCL 50 MG PO TABS: 50.0000 mg | ORAL_TABLET | Freq: Every evening | ORAL | 3 refills | Status: DC | PRN

## 2019-03-16 MED ORDER — LOVASTATIN 40 MG PO TABS: 40.0000 mg | ORAL_TABLET | Freq: Every day | ORAL | 1 refills | Status: DC

## 2019-03-16 MED ORDER — POTASSIUM CHLORIDE ER 10 MEQ PO CPCR: 10.0000 meq | ORAL_CAPSULE | Freq: Every day | ORAL | 0 refills | Status: DC

## 2019-03-16 NOTE — Progress Notes (Signed)
Subjective:     Patient ID: Tara Collins , female    DOB: 03-24-1935 , 84 y.o.   MRN: [id number]   Chief Complaint  Patient presents with  . Annual Exam   The patient states she uses status post hysterectomy for birth control. Last LMP was No LMP recorded. Patient has had a hysterectomy..  Mammogram no longer getting.  Negative for: breast discharge, breast lump(s), breast pain and breast self exam.  Pertinent negatives include no abnormal bleeding, anxiety, decreased libido, depression, difficulty falling sleep, dyspareunia, history of infertility, nocturia, sexual dysfunction, sleep disturbances, urinary incontinence, urinary urgency, vaginal discharge and vaginal itching. Diet regular. The patient states her exercise level is  walking and gardening 3-4 days as week.    The patient's tobacco use is:  Social History   Tobacco Use  Smoking Status Never Smoker  Smokeless Tobacco Never Used   She has been exposed to passive smoke. The patient's alcohol use is:  Social History   Substance and Sexual Activity  Alcohol Use Not Currently  . Frequency: Never    HPI  Here for HM  Wt Readings from Last 3 Encounters: 03/16/19 : 154 lb (69.9 kg) 07/05/18 : 150 lb 3.2 oz (68.1 kg) 06/28/18 : 150 lb 3.2 oz (68.1 kg)     Past Medical History:  Diagnosis Date  . Arthritis   . Hepatitis B 03/1963  . Hypertension   . PONV (postoperative nausea and vomiting)   . Primary localized osteoarthritis of right knee 07/05/2018  . Seasonal allergies      No family history on file.   Current Outpatient Medications:  .  amLODipine (NORVASC) 10 MG tablet, TAKE 1 TABLET BY MOUTH ONCE DAILY, Disp: 90 tablet, Rfl: 1 .  aspirin EC 325 MG tablet, Take 1 tablet (325 mg total) by mouth 2 (two) times daily. (Patient not taking: Reported on 03/16/2019), Disp: 60 tablet, Rfl: 0 .  baclofen (LIORESAL) 10 MG tablet, Take 1 tablet (10 mg total) by mouth 3 (three) times daily. As needed for muscle spasm  (Patient not taking: Reported on 03/16/2019), Disp: 50 tablet, Rfl: 0 .  carboxymethylcellulose (REFRESH PLUS) 0.5 % SOLN, Place 1 drop into both eyes daily as needed (dry eyes)., Disp: , Rfl:  .  HYDROcodone-acetaminophen (NORCO) 10-325 MG tablet, Take 1 tablet by mouth every 6 (six) hours as needed. (Patient not taking: Reported on 03/16/2019), Disp: 28 tablet, Rfl: 0 .  losartan (COZAAR) 100 MG tablet, TAKE 1 TABLET BY MOUTH EVERY DAY, Disp: 90 tablet, Rfl: 1 .  lovastatin (MEVACOR) 40 MG tablet, Take 1 tablet (40 mg total) by mouth daily., Disp: 90 tablet, Rfl: 1 .  ondansetron (ZOFRAN) 4 MG tablet, Take 1 tablet (4 mg total) by mouth every 8 (eight) hours as needed for nausea or vomiting. (Patient not taking: Reported on 03/16/2019), Disp: 10 tablet, Rfl: 0 .  potassium chloride (MICRO-K) 10 MEQ CR capsule, Take 1 capsule (10 mEq total) by mouth daily., Disp: 90 capsule, Rfl: 0 .  traZODone (DESYREL) 50 MG tablet, Take 1 tablet (50 mg total) by mouth at bedtime as needed for sleep., Disp: 30 tablet, Rfl: 3   Allergies  Allergen Reactions  . Contrast Media [Iodinated Diagnostic Agents] Shortness Of Breath    ALLERGY ADDED FROM QUESTIONING PT  SHE HAD A PREVIOUS REACTION TO CONTRAST   . Latex Rash    "skin burns"     Review of Systems  Constitutional: Negative.   HENT: Negative.  Eyes: Negative.   Respiratory: Negative.   Cardiovascular: Negative.   Gastrointestinal: Negative.   Endocrine: Negative.   Genitourinary: Negative.   Musculoskeletal: Negative.   Skin: Negative.   Allergic/Immunologic: Negative.   Neurological: Negative.   Hematological: Negative.   Psychiatric/Behavioral: Negative.      There were no vitals filed for this visit. There is no height or weight on file to calculate BMI.   Objective:  Physical Exam Constitutional:      General: She is not in acute distress.    Appearance: Normal appearance. She is well-developed. She is obese.  HENT:     Head:  Normocephalic and atraumatic.     Right Ear: Hearing, tympanic membrane, ear canal and external ear normal.     Left Ear: Hearing, tympanic membrane, ear canal and external ear normal.     Nose: Nose normal.     Mouth/Throat:     Mouth: Mucous membranes are moist.  Eyes:     General: Lids are normal.     Conjunctiva/sclera: Conjunctivae normal.     Pupils: Pupils are equal, round, and reactive to light.     Funduscopic exam:    Right eye: No papilledema.        Left eye: No papilledema.  Neck:     Musculoskeletal: Full passive range of motion without pain, normal range of motion and neck supple.     Thyroid: No thyroid mass.     Vascular: No carotid bruit.  Cardiovascular:     Rate and Rhythm: Normal rate and regular rhythm.     Pulses: Normal pulses.     Heart sounds: Normal heart sounds. No murmur.  Pulmonary:     Effort: Pulmonary effort is normal.     Breath sounds: Normal breath sounds.  Chest:     Breasts:        Right: Normal. No mass, nipple discharge or tenderness.        Left: Normal. No mass, nipple discharge or tenderness.  Abdominal:     General: Abdomen is flat. Bowel sounds are normal.     Palpations: Abdomen is soft.  Musculoskeletal: Normal range of motion.        General: No swelling.     Right lower leg: No edema.     Left lower leg: No edema.  Lymphadenopathy:     Upper Body:     Right upper body: No supraclavicular adenopathy.     Left upper body: No supraclavicular adenopathy.  Skin:    General: Skin is warm and dry.     Capillary Refill: Capillary refill takes less than 2 seconds.  Neurological:     General: No focal deficit present.     Mental Status: She is alert and oriented to person, place, and time.     Cranial Nerves: No cranial nerve deficit.     Sensory: No sensory deficit.  Psychiatric:        Mood and Affect: Mood normal.        Behavior: Behavior normal.        Thought Content: Thought content normal.        Judgment: Judgment  normal.         Assessment And Plan:     1. Health maintenance examination . Behavior modifications discussed and diet history reviewed.   . Pt will continue to exercise regularly and modify diet with low GI, plant based foods and decrease intake of processed foods.  . Recommend intake of daily  multivitamin, Vitamin D, and calcium.  . Recommend mammogram and colonoscopy for preventive screenings, as well as recommend immunizations that include influenza, TDAP  2. Essential hypertension  Chronic,   EKG reveals sinus rhythm, no acute changes - POCT Urinalysis Dipstick (81002) - EKG 12-Lead - Microalbumin / Creatinine Urine Ratio - BMP8+Anion Gap - CBC no Diff - losartan (COZAAR) 100 MG tablet; Take 1 tablet (100 mg total) by mouth daily.  Dispense: 90 tablet; Refill: 1 - lovastatin (MEVACOR) 40 MG tablet; Take 1 tablet (40 mg total) by mouth daily.  Dispense: 90 tablet; Refill: 1  3. Dyslipidemia  Chronic, controlled  Continue with current medications - Lipid Profile - lovastatin (MEVACOR) 40 MG tablet; Take 1 tablet (40 mg total) by mouth daily.  Dispense: 90 tablet; Refill: 1  4. HYPOKALEMIA  Chronic, continue with potassium supplement - potassium chloride (MICRO-K) 10 MEQ CR capsule; Take 1 capsule (10 mEq total) by mouth daily.  Dispense: 90 capsule; Refill: 0  5. Insomnia, unspecified type  Chronic, stable  Continue with current medications - traZODone (DESYREL) 50 MG tablet; Take 1 tablet (50 mg total) by mouth at bedtime as needed for sleep.  Dispense: 30 tablet; Refill: 3   Minette Brine, FNP    THE PATIENT IS ENCOURAGED TO PRACTICE SOCIAL DISTANCING DUE TO THE COVID-19 PANDEMIC.

## 2019-03-16 NOTE — Progress Notes (Signed)
Subjective:   Fabio AsaLucy L Zeis is a 84 y.o. female who presents for Medicare Annual (Subsequent) preventive examination.  Review of Systems:  n/a Cardiac Risk Factors include: advanced age (>1455men, 91>65 women)     Objective:     Vitals: BP 128/64 (BP Location: Left Arm, Patient Position: Sitting, Cuff Size: Normal)   Pulse 76   Temp 98.4 F (36.9 C) (Oral)   Ht 5' 1.8" (1.57 m)   Wt 154 lb (69.9 kg)   BMI 28.35 kg/m   Body mass index is 28.35 kg/m.  Advanced Directives 03/16/2019 07/26/2018 07/05/2018 06/28/2018  Does Patient Have a Medical Advance Directive? No No No No  Would patient like information on creating a medical advance directive? - No - Patient declined No - Patient declined No - Patient declined    Tobacco Social History   Tobacco Use  Smoking Status Never Smoker  Smokeless Tobacco Never Used     Counseling given: Not Answered   Clinical Intake:  Pre-visit preparation completed: Yes  Pain : No/denies pain     Nutritional Status: BMI 25 -29 Overweight Nutritional Risks: None Diabetes: No  How often do you need to have someone help you when you read instructions, pamphlets, or other written materials from your doctor or pharmacy?: 1 - Never What is the last grade level you completed in school?: 12th grade  Interpreter Needed?: No  Information entered by :: NAllen LPN  Past Medical History:  Diagnosis Date  . Arthritis   . Hepatitis B 03/1963  . Hypertension   . PONV (postoperative nausea and vomiting)   . Primary localized osteoarthritis of right knee 07/05/2018  . Seasonal allergies    Past Surgical History:  Procedure Laterality Date  . ABDOMINAL HYSTERECTOMY  01/1991  . APPENDECTOMY    . CATARACT EXTRACTION, BILATERAL    . CHOLECYSTECTOMY  10/2017  . PARTIAL KNEE ARTHROPLASTY Right 07/05/2018   Procedure: UNICOMPARTMENTAL KNEE;  Surgeon: Teryl LucyLandau, Joshua, MD;  Location: [facility];  Service: Orthopedics;  Laterality: Right;  .  REPLACEMENT TOTAL KNEE Left 2012   History reviewed. No pertinent family history. Social History   Socioeconomic History  . Marital status: Single    Spouse name: Not on file  . Number of children: Not on file  . Years of education: Not on file  . Highest education level: Not on file  Occupational History  . Occupation: retired  Engineer, productionocial Needs  . Financial resource strain: Not hard at all  . Food insecurity    Worry: Never true    Inability: Never true  . Transportation needs    Medical: No    Non-medical: No  Tobacco Use  . Smoking status: Never Smoker  . Smokeless tobacco: Never Used  Substance and Sexual Activity  . Alcohol use: Not Currently    Frequency: Never  . Drug use: Not Currently  . Sexual activity: Not Currently  Lifestyle  . Physical activity    Days per week: 0 days    Minutes per session: 0 min  . Stress: Not at all  Relationships  . Social Musicianconnections    Talks on phone: Not on file    Gets together: Not on file    Attends religious service: Not on file    Active member of club or organization: Not on file    Attends meetings of clubs or organizations: Not on file    Relationship status: Not on file  Other Topics Concern  . Not on file  Social History Narrative  . Not on file    Outpatient Encounter Medications as of 03/16/2019  Medication Sig  . amLODipine (NORVASC) 10 MG tablet Take 1 tablet (10 mg total) by mouth daily.  . carboxymethylcellulose (REFRESH PLUS) 0.5 % SOLN Place 1 drop into both eyes daily as needed (dry eyes).  Marland Kitchen losartan (COZAAR) 100 MG tablet Take 1 tablet (100 mg total) by mouth daily.  Marland Kitchen lovastatin (MEVACOR) 40 MG tablet Take 1 tablet (40 mg total) by mouth daily.  . potassium chloride (MICRO-K) 10 MEQ CR capsule TAKE 1 CAPSULE BY MOUTH EVERY DAY WITH FOOD  . traZODone (DESYREL) 50 MG tablet TAKE 1 TABLET(50 MG) BY MOUTH AT BEDTIME  . aspirin EC 325 MG tablet Take 1 tablet (325 mg total) by mouth 2 (two) times daily. (Patient  not taking: Reported on 03/16/2019)  . baclofen (LIORESAL) 10 MG tablet Take 1 tablet (10 mg total) by mouth 3 (three) times daily. As needed for muscle spasm (Patient not taking: Reported on 03/16/2019)  . HYDROcodone-acetaminophen (NORCO) 10-325 MG tablet Take 1 tablet by mouth every 6 (six) hours as needed. (Patient not taking: Reported on 03/16/2019)  . ondansetron (ZOFRAN) 4 MG tablet Take 1 tablet (4 mg total) by mouth every 8 (eight) hours as needed for nausea or vomiting. (Patient not taking: Reported on 03/16/2019)  . sennosides-docusate sodium (SENOKOT-S) 8.6-50 MG tablet Take 2 tablets by mouth daily. (Patient not taking: Reported on 03/16/2019)  . [DISCONTINUED] potassium chloride (MICRO-K) 10 MEQ CR capsule Take 10 mEq by mouth daily.   No facility-administered encounter medications on file as of 03/16/2019.     Activities of Daily Living In your present state of health, do you have any difficulty performing the following activities: 03/16/2019 07/05/2018  Hearing? N -  Vision? N -  Difficulty concentrating or making decisions? N -  Walking or climbing stairs? Y -  Comment due to knee replacement -  Dressing or bathing? N -  Doing errands, shopping? N N  Preparing Food and eating ? N -  Using the Toilet? N -  In the past six months, have you accidently leaked urine? N -  Comment sometimes with sneeze -  Do you have problems with loss of bowel control? N -  Managing your Medications? N -  Managing your Finances? N -  Housekeeping or managing your Housekeeping? N -  Some recent data might be hidden    Patient Care Team: Minette Brine, FNP as PCP - General (General Practice)    Assessment:   This is a routine wellness examination for Angla.  Exercise Activities and Dietary recommendations Current Exercise Habits: The patient does not participate in regular exercise at present  Goals    . patient state     03/16/2019, to eat better       Fall Risk Fall Risk  03/16/2019  06/20/2018  Falls in the past year? 0 0  Risk for fall due to : Medication side effect -  Follow up Falls evaluation completed;Education provided;Falls prevention discussed -   Is the patient's home free of loose throw rugs in walkways, pet beds, electrical cords, etc?   yes      Grab bars in the bathroom? yes      Handrails on the stairs?   n/a      Adequate lighting?   yes  Timed Get Up and Go performed: n/a  Depression Screen PHQ 2/9 Scores 03/16/2019 06/20/2018  PHQ - 2 Score 0  0  PHQ- 9 Score 1 -     Cognitive Function     6CIT Screen 03/16/2019  What Year? 0 points  What month? 0 points  What time? 0 points  Count back from 20 0 points  Months in reverse 0 points  Repeat phrase 0 points  Total Score 0    Immunization History  Administered Date(s) Administered  . Influenza Whole 07/27/2006  . Pneumococcal Polysaccharide-23 11/29/2008    Qualifies for Shingles Vaccine? yes  Screening Tests Health Maintenance  Topic Date Due  . TETANUS/TDAP  01/12/1956  . PNA vac Low Risk Adult (2 of 2 - PCV13) 11/29/2009  . INFLUENZA VACCINE  02/25/2019  . DEXA SCAN  Completed    Cancer Screenings: Lung: Low Dose CT Chest recommended if Age 63-80 years, 30 pack-year currently smoking OR have quit w/in 15years. Patient does not qualify. Breast:  Up to date on Mammogram? Yes   Up to date of Bone Density/Dexa? Yes Colorectal: not required  Additional Screenings: : Hepatitis C Screening: n/a     Plan:    Patient goal is to eat better   I have personally reviewed and noted the following in the patient's chart:  . Medical and social history . Use of alcohol, tobacco or illicit drugs  . Current medications and supplements . Functional ability and status . Nutritional status . Physical activity . Advanced directives . List of other physicians . Hospitalizations, surgeries, and ER visits in previous 12 months . Vitals . Screenings to include cognitive, depression, and  falls . Referrals and appointments  In addition, I have reviewed and discussed with patient certain preventive protocols, quality metrics, and best practice recommendations. A written personalized care plan for preventive services as well as general preventive health recommendations were provided to patient.     Barb Merinoickeah E Harvy Riera, LPN  5/62/13088/20/2020

## 2019-03-16 NOTE — Patient Instructions (Signed)
[name] , Thank you for taking time to come for your Medicare Wellness Visit. I appreciate your ongoing commitment to your health goals. Please review the following plan we discussed and let me know if I can assist you in the future.   Screening recommendations/referrals: Colonoscopy: not required Mammogram: not required Bone Density: 04/2015 Recommended yearly ophthalmology/optometry visit for glaucoma screening and checkup Recommended yearly dental visit for hygiene and checkup  Vaccinations: Influenza vaccine: declines Pneumococcal vaccine: declines Tdap vaccine: declines Shingles vaccine: discussed    Advanced directives: Advance directive discussed with you today. Even though you declined this today please call our office should you change your mind and we can give you the proper paperwork for you to fill out.   Conditions/risks identified: overweight  Next appointment: 03/21/2020 at 10:00   Preventive Care 40 Years and Older, Female Preventive care refers to lifestyle choices and visits with your health care provider that can promote health and wellness. What does preventive care include?  A yearly physical exam. This is also called an annual well check.  Dental exams once or twice a year.  Routine eye exams. Ask your health care provider how often you should have your eyes checked.  Personal lifestyle choices, including:  Daily care of your teeth and gums.  Regular physical activity.  Eating a healthy diet.  Avoiding tobacco and drug use.  Limiting alcohol use.  Practicing safe sex.  Taking low-dose aspirin every day.  Taking vitamin and mineral supplements as recommended by your health care provider. What happens during an annual well check? The services and screenings done by your health care provider during your annual well check will depend on your age, overall health, lifestyle risk factors, and family history of disease. Counseling  Your health  care provider may ask you questions about your:  Alcohol use.  Tobacco use.  Drug use.  Emotional well-being.  Home and relationship well-being.  Sexual activity.  Eating habits.  History of falls.  Memory and ability to understand (cognition).  Work and work Statistician.  Reproductive health. Screening  You may have the following tests or measurements:  Height, weight, and BMI.  Blood pressure.  Lipid and cholesterol levels. These may be checked every 5 years, or more frequently if you are over 30 years old.  Skin check.  Lung cancer screening. You may have this screening every year starting at age 69 if you have a 30-pack-year history of smoking and currently smoke or have quit within the past 15 years.  Fecal occult blood test (FOBT) of the stool. You may have this test every year starting at age 41.  Flexible sigmoidoscopy or colonoscopy. You may have a sigmoidoscopy every 5 years or a colonoscopy every 10 years starting at age 74.  Hepatitis C blood test.  Hepatitis B blood test.  Sexually transmitted disease (STD) testing.  Diabetes screening. This is done by checking your blood sugar (glucose) after you have not eaten for a while (fasting). You may have this done every 1-3 years.  Bone density scan. This is done to screen for osteoporosis. You may have this done starting at age 70.  Mammogram. This may be done every 1-2 years. Talk to your health care provider about how often you should have regular mammograms. Talk with your health care provider about your test results, treatment options, and if necessary, the need for more tests. Vaccines  Your health care provider may recommend certain vaccines, such as:  Influenza vaccine. This is  recommended every year.  Tetanus, diphtheria, and acellular pertussis (Tdap, Td) vaccine. You may need a Td booster every 10 years.  Zoster vaccine. You may need this after age 68.  Pneumococcal 13-valent conjugate  (PCV13) vaccine. One dose is recommended after age 63.  Pneumococcal polysaccharide (PPSV23) vaccine. One dose is recommended after age 9. Talk to your health care provider about which screenings and vaccines you need and how often you need them. This information is not intended to replace advice given to you by your health care provider. Make sure you discuss any questions you have with your health care provider. Document Released: 08/09/2015 Document Revised: 04/01/2016 Document Reviewed: 05/14/2015 Elsevier Interactive Patient Education  2017 [employer] Prevention in the Home Falls can cause injuries. They can happen to people of all ages. There are many things you can do to make your home safe and to help prevent falls. What can I do on the outside of my home?  Regularly fix the edges of walkways and driveways and fix any cracks.  Remove anything that might make you trip as you walk through a door, such as a raised step or threshold.  Trim any bushes or trees on the path to your home.  Use bright outdoor lighting.  Clear any walking paths of anything that might make someone trip, such as rocks or tools.  Regularly check to see if handrails are loose or broken. Make sure that both sides of any steps have handrails.  Any raised decks and porches should have guardrails on the edges.  Have any leaves, snow, or ice cleared regularly.  Use sand or salt on walking paths during winter.  Clean up any spills in your garage right away. This includes oil or grease spills. What can I do in the bathroom?  Use night lights.  Install grab bars by the toilet and in the tub and shower. Do not use towel bars as grab bars.  Use non-skid mats or decals in the tub or shower.  If you need to sit down in the shower, use a plastic, non-slip stool.  Keep the floor dry. Clean up any water that spills on the floor as soon as it happens.  Remove soap buildup in the tub or shower  regularly.  Attach bath mats securely with double-sided non-slip rug tape.  Do not have throw rugs and other things on the floor that can make you trip. What can I do in the bedroom?  Use night lights.  Make sure that you have a light by your bed that is easy to reach.  Do not use any sheets or blankets that are too big for your bed. They should not hang down onto the floor.  Have a firm chair that has side arms. You can use this for support while you get dressed.  Do not have throw rugs and other things on the floor that can make you trip. What can I do in the kitchen?  Clean up any spills right away.  Avoid walking on wet floors.  Keep items that you use a lot in easy-to-reach places.  If you need to reach something above you, use a strong step stool that has a grab bar.  Keep electrical cords out of the way.  Do not use floor polish or wax that makes floors slippery. If you must use wax, use non-skid floor wax.  Do not have throw rugs and other things on the floor that can make you trip.  What can I do with my stairs?  Do not leave any items on the stairs.  Make sure that there are handrails on both sides of the stairs and use them. Fix handrails that are broken or loose. Make sure that handrails are as long as the stairways.  Check any carpeting to make sure that it is firmly attached to the stairs. Fix any carpet that is loose or worn.  Avoid having throw rugs at the top or bottom of the stairs. If you do have throw rugs, attach them to the floor with carpet tape.  Make sure that you have a light switch at the top of the stairs and the bottom of the stairs. If you do not have them, ask someone to add them for you. What else can I do to help prevent falls?  Wear shoes that:  Do not have high heels.  Have rubber bottoms.  Are comfortable and fit you well.  Are closed at the toe. Do not wear sandals.  If you use a stepladder:  Make sure that it is fully  opened. Do not climb a closed stepladder.  Make sure that both sides of the stepladder are locked into place.  Ask someone to hold it for you, if possible.  Clearly mark and make sure that you can see:  Any grab bars or handrails.  First and last steps.  Where the edge of each step is.  Use tools that help you move around (mobility aids) if they are needed. These include:  Canes.  Walkers.  Scooters.  Crutches.  Turn on the lights when you go into a dark area. Replace any light bulbs as soon as they burn out.  Set up your furniture so you have a clear path. Avoid moving your furniture around.  If any of your floors are uneven, fix them.  If there are any pets around you, be aware of where they are.  Review your medicines with your doctor. Some medicines can make you feel dizzy. This can increase your chance of falling. Ask your doctor what other things that you can do to help prevent falls. This information is not intended to replace advice given to you by your health care provider. Make sure you discuss any questions you have with your health care provider. Document Released: 05/09/2009 Document Revised: 12/19/2015 Document Reviewed: 08/17/2014 Elsevier Interactive Patient Education  2017 [employer].

## 2019-03-17 LAB — MICROALBUMIN / CREATININE URINE RATIO
Creatinine, Urine: 140.2 mg/dL
Microalb/Creat Ratio: 81 mg/g creat — ABNORMAL HIGH (ref 0–29)
Microalbumin, Urine: 113.8 ug/mL

## 2019-03-21 ENCOUNTER — Other Ambulatory Visit: Payer: Self-pay | Admitting: Nurse Practitioner

## 2019-03-21 DIAGNOSIS — I10 Essential (primary) hypertension: Secondary | ICD-10-CM

## 2019-03-23 ENCOUNTER — Telehealth: Payer: Self-pay

## 2019-03-23 NOTE — Telephone Encounter (Signed)
Patient returned my call regarding her labs. YRL,RMA

## 2019-03-25 ENCOUNTER — Encounter: Payer: Self-pay | Admitting: Nurse Practitioner

## 2019-04-25 ENCOUNTER — Other Ambulatory Visit: Payer: Self-pay | Admitting: Nurse Practitioner

## 2019-04-25 DIAGNOSIS — E876 Hypokalemia: Secondary | ICD-10-CM

## 2019-08-14 ENCOUNTER — Other Ambulatory Visit: Payer: Self-pay | Admitting: Nurse Practitioner

## 2019-08-14 DIAGNOSIS — G4700 Insomnia, unspecified: Secondary | ICD-10-CM

## 2019-08-14 NOTE — Telephone Encounter (Signed)
Please refill patient's prescription 

## 2019-09-13 ENCOUNTER — Other Ambulatory Visit: Payer: Self-pay | Admitting: Nurse Practitioner

## 2019-09-13 DIAGNOSIS — I10 Essential (primary) hypertension: Secondary | ICD-10-CM

## 2019-09-13 DIAGNOSIS — E785 Hyperlipidemia, unspecified: Secondary | ICD-10-CM

## 2019-09-17 ENCOUNTER — Other Ambulatory Visit: Payer: Self-pay | Admitting: Nurse Practitioner

## 2019-09-17 DIAGNOSIS — E876 Hypokalemia: Secondary | ICD-10-CM

## 2019-09-21 ENCOUNTER — Ambulatory Visit: Payer: Medicare HMO | Admitting: Nurse Practitioner

## 2019-10-23 ENCOUNTER — Ambulatory Visit (INDEPENDENT_AMBULATORY_CARE_PROVIDER_SITE_OTHER): Payer: Medicare HMO | Admitting: Nurse Practitioner

## 2019-10-23 ENCOUNTER — Other Ambulatory Visit: Payer: Self-pay

## 2019-10-23 ENCOUNTER — Encounter: Payer: Self-pay | Admitting: Nurse Practitioner

## 2019-10-23 VITALS — BP 118/72 | HR 74 | Temp 98.7°F | Ht 61.8 in | Wt 152.6 lb

## 2019-10-23 DIAGNOSIS — R252 Cramp and spasm: Secondary | ICD-10-CM | POA: Diagnosis not present

## 2019-10-23 DIAGNOSIS — I10 Essential (primary) hypertension: Secondary | ICD-10-CM

## 2019-10-23 DIAGNOSIS — M25511 Pain in right shoulder: Secondary | ICD-10-CM | POA: Diagnosis not present

## 2019-10-23 DIAGNOSIS — E785 Hyperlipidemia, unspecified: Secondary | ICD-10-CM

## 2019-10-23 DIAGNOSIS — G8929 Other chronic pain: Secondary | ICD-10-CM

## 2019-10-23 MED ORDER — DICLOFENAC SODIUM 1 % EX GEL: 2.0000 g | Freq: Four times a day (QID) | CUTANEOUS | 1 refills | Status: DC

## 2019-10-23 NOTE — Patient Instructions (Signed)
   Take magnesium 200 mg with evening meal daily to help with cramps

## 2019-10-23 NOTE — Progress Notes (Signed)
Subjective:     Patient ID: Tara Collins , female    DOB: 1935/04/19 , 85 y.o.   MRN: [id number]   Chief Complaint  Patient presents with  . Hypertension    HPI  Hyperlipidemia - She is doing well with her cholesterol medications.  No current concerns.   She has had the covid vaccine took the 2nd one on March 10th, had a sore arm. Pfzizer  Hypertension This is a chronic problem. The current episode started more than 1 year ago. The problem is controlled. Pertinent negatives include no anxiety, chest pain, headaches, malaise/fatigue, palpitations or shortness of breath. Risk factors for coronary artery disease include dyslipidemia and obesity. The current treatment provides moderate improvement. There are no compliance problems.  There is no history of angina or kidney disease. There is no history of chronic renal disease.     Past Medical History:  Diagnosis Date  . Arthritis   . Hepatitis B 03/1963  . Hypertension   . PONV (postoperative nausea and vomiting)   . Primary localized osteoarthritis of right knee 07/05/2018  . Seasonal allergies      No family history on file.   Current Outpatient Medications:  .  amLODipine (NORVASC) 10 MG tablet, TAKE 1 TABLET BY MOUTH ONCE DAILY, Disp: 90 tablet, Rfl: 1 .  aspirin EC 325 MG tablet, Take 1 tablet (325 mg total) by mouth 2 (two) times daily., Disp: 60 tablet, Rfl: 0 .  carboxymethylcellulose (REFRESH PLUS) 0.5 % SOLN, Place 1 drop into both eyes daily as needed (dry eyes)., Disp: , Rfl:  .  losartan (COZAAR) 100 MG tablet, TAKE 1 TABLET(100 MG) BY MOUTH DAILY, Disp: 90 tablet, Rfl: 1 .  lovastatin (MEVACOR) 40 MG tablet, TAKE 1 TABLET(40 MG) BY MOUTH DAILY, Disp: 90 tablet, Rfl: 1 .  potassium chloride (MICRO-K) 10 MEQ CR capsule, TAKE 1 CAPSULE(10 MEQ) BY MOUTH DAILY, Disp: 90 capsule, Rfl: 0 .  traZODone (DESYREL) 50 MG tablet, TAKE 1 TABLET(50 MG) BY MOUTH AT BEDTIME AS NEEDED FOR SLEEP, Disp: 30 tablet, Rfl: 3   Allergies   Allergen Reactions  . Contrast Media [Iodinated Diagnostic Agents] Shortness Of Breath    ALLERGY ADDED FROM QUESTIONING PT  SHE HAD A PREVIOUS REACTION TO CONTRAST   . Latex Rash    "skin burns"     Review of Systems  Constitutional: Negative.  Negative for fatigue and malaise/fatigue.  Eyes: Negative for visual disturbance.  Respiratory: Negative.  Negative for shortness of breath.   Cardiovascular: Negative.  Negative for chest pain, palpitations and leg swelling.  Gastrointestinal: Negative.   Endocrine: Negative.   Musculoskeletal: Negative.   Skin: Negative.   Neurological: Negative for dizziness, weakness and headaches.  Psychiatric/Behavioral: Negative for confusion. The patient is not nervous/anxious.      Today's Vitals   10/23/19 1021  BP: 118/72  Pulse: 74  Temp: 98.7 F (37.1 C)  TempSrc: Oral  SpO2: 98%  Weight: 152 lb 9.6 oz (69.2 kg)  Height: 5' 1.8" (1.57 m)   Body mass index is 28.09 kg/m.   Objective:  Physical Exam Vitals reviewed.  Constitutional:      Appearance: She is well-developed.  Eyes:     Pupils: Pupils are equal, round, and reactive to light.  Cardiovascular:     Rate and Rhythm: Normal rate and regular rhythm.     Heart sounds: Normal heart sounds. No murmur.  Pulmonary:     Effort: Pulmonary effort is normal.  Breath sounds: Normal breath sounds.  Musculoskeletal:        General: Normal range of motion.  Skin:    General: Skin is warm and dry.     Capillary Refill: Capillary refill takes less than 2 seconds.  Neurological:     Mental Status: She is alert and oriented to person, place, and time.     Cranial Nerves: No cranial nerve deficit.         Assessment And Plan:     1. Essential hypertension B/P is controlled.  CMP ordered to check renal function.  The importance of regular exercise and dietary modification was stressed to the patient.   2. Dyslipidemia  Chronic, stable  Will check lipid panel - Lipid  panel  3. Leg cramps  Will check magnesium level   Encouraged to take magnesium supplement with evening meal - Magnesium  4. Chronic right shoulder pain  She is able to lift arm above her head  Will try diclofenac gel if not better may need steroid dose pack.  - diclofenac Sodium (VOLTAREN) 1 % GEL; Apply 2 g topically 4 (four) times daily.  Dispense: 100 g; Refill: [place], FNP

## 2019-10-24 LAB — MAGNESIUM: Magnesium: 2.4 mg/dL — ABNORMAL HIGH (ref 1.6–2.3)

## 2019-10-24 LAB — LIPID PANEL
Chol/HDL Ratio: 2.4 ratio (ref 0.0–4.4)
Cholesterol, Total: 116 mg/dL (ref 100–199)
HDL: 48 mg/dL (ref >39)
LDL Chol Calc (NIH): 48 mg/dL (ref 0–99)
Triglycerides: 106 mg/dL (ref 0–149)
VLDL Cholesterol Cal: 20 mg/dL (ref 5–40)

## 2019-11-24 IMAGING — CT CT ABD-PELV W/O CM
2 of 4 series · 17 of 46 positions shown, 19 images · non-contrast
Comparison: None.

CLINICAL DATA: Mid and lower abdominal pain.  No vomiting.

EXAM:
CT ABDOMEN AND PELVIS WITHOUT CONTRAST
TECHNIQUE: Multidetector CT imaging of the abdomen and pelvis was performed
following the standard protocol without IV contrast.

[Series 3: a/p w/o 5mm · axial · non-contrast · 0.67mm/px · z∈[+975,+1305]mm · 14 of 72 slices shown, 16 images]
[im 3/72  soft-tissue]
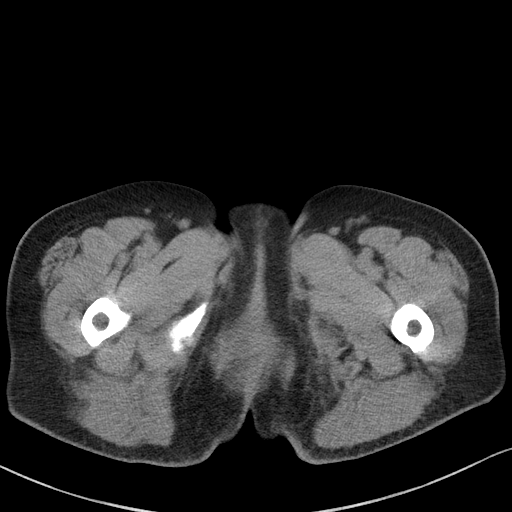
[im 3/72  bone]
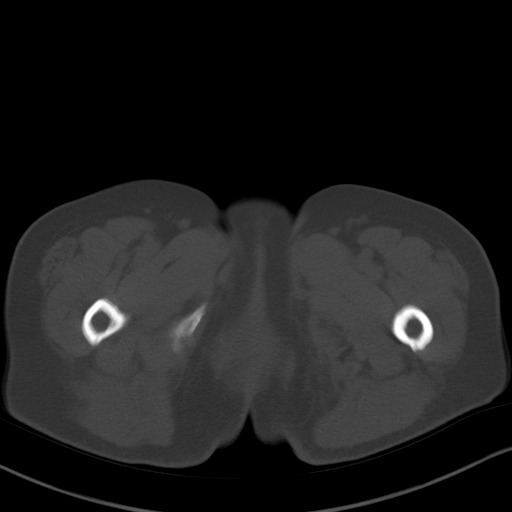
[im 9/72  soft-tissue]
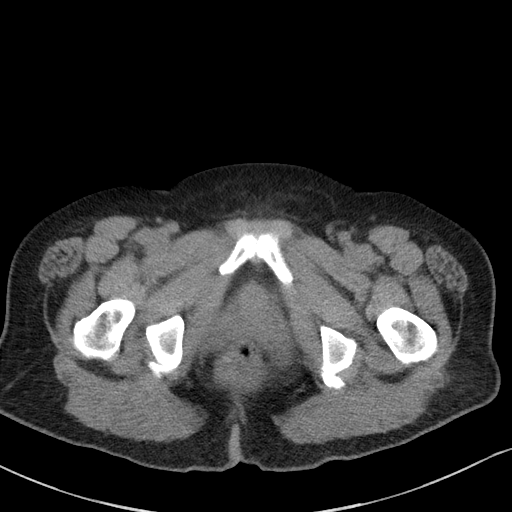
[im 14/72  soft-tissue]
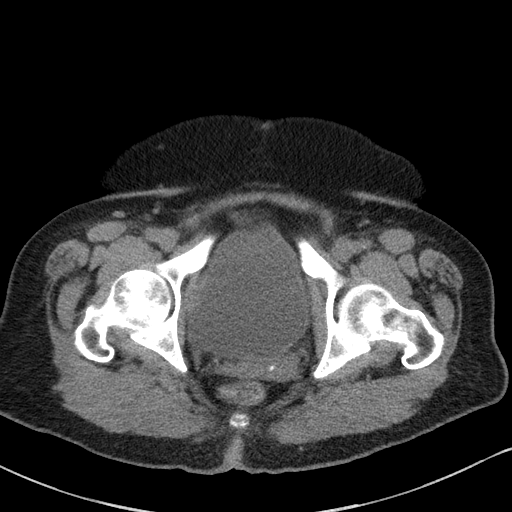
[im 20/72  soft-tissue]
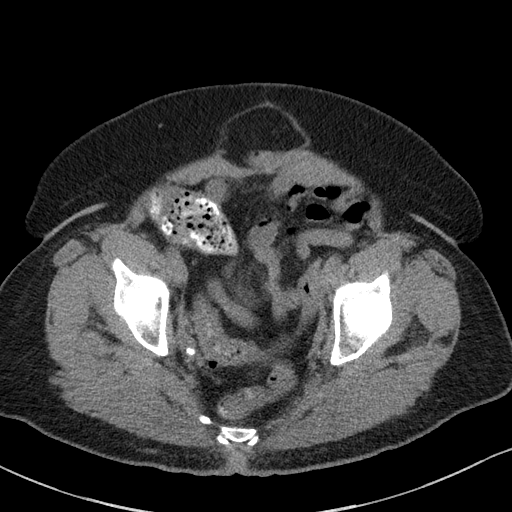
[im 25/72  soft-tissue]
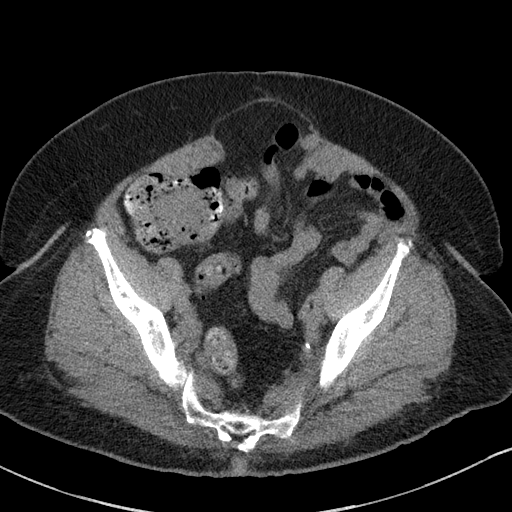
[im 28/72  soft-tissue]
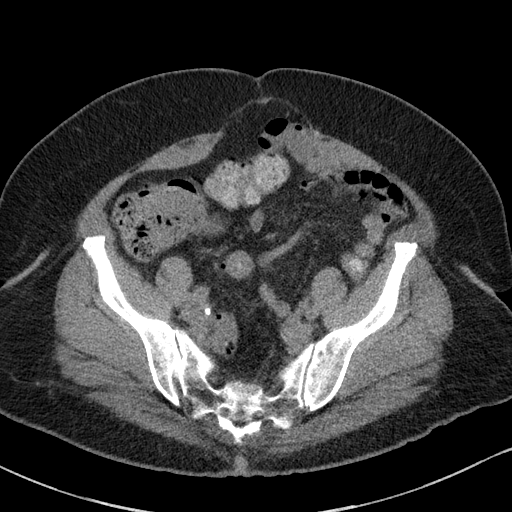
[im 33/72  soft-tissue]
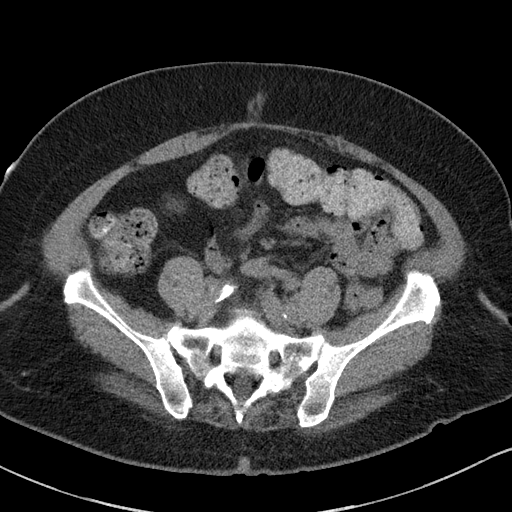
[im 39/72  soft-tissue]
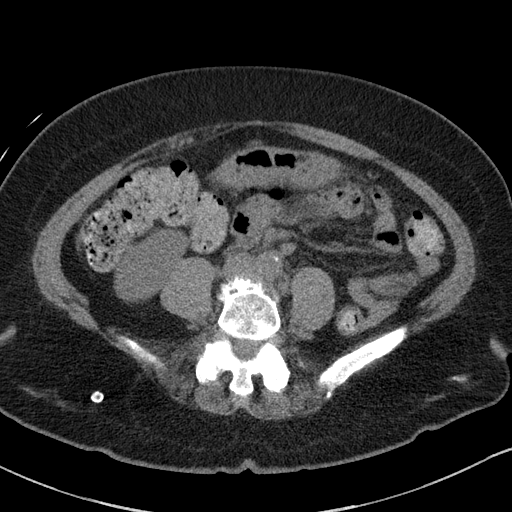
[im 44/72  soft-tissue]
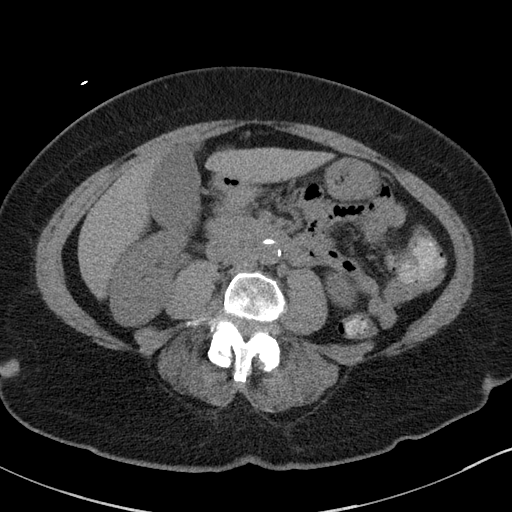
[im 44/72  bone]
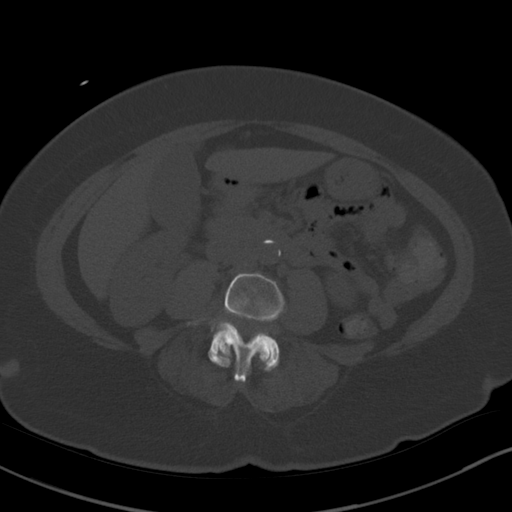
[im 47/72  soft-tissue]
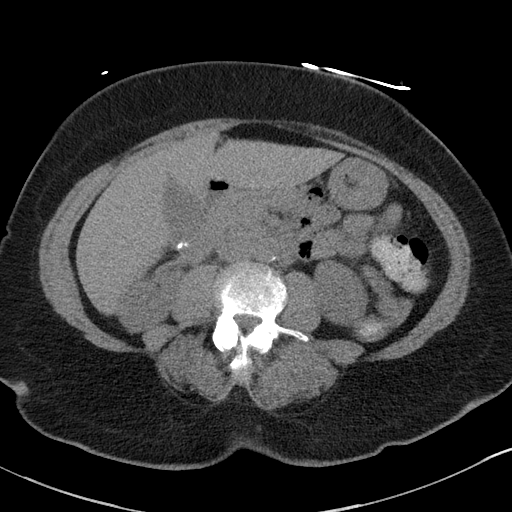
[im 52/72  soft-tissue]
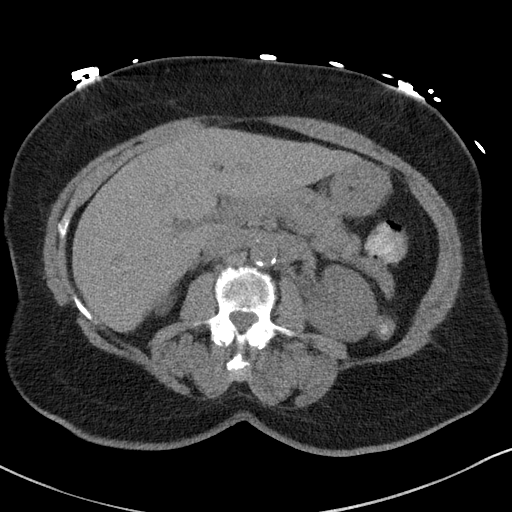
[im 58/72  soft-tissue]
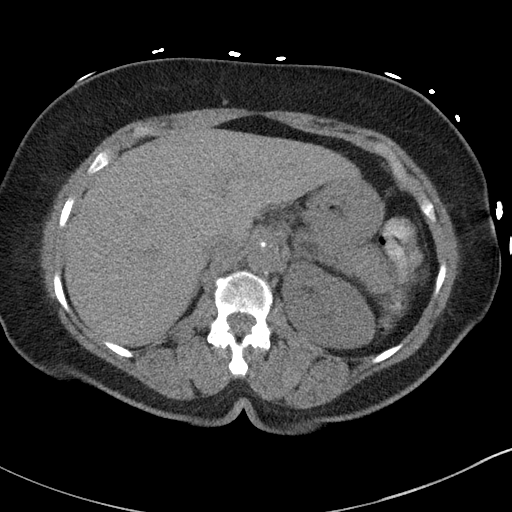
[im 63/72  soft-tissue]
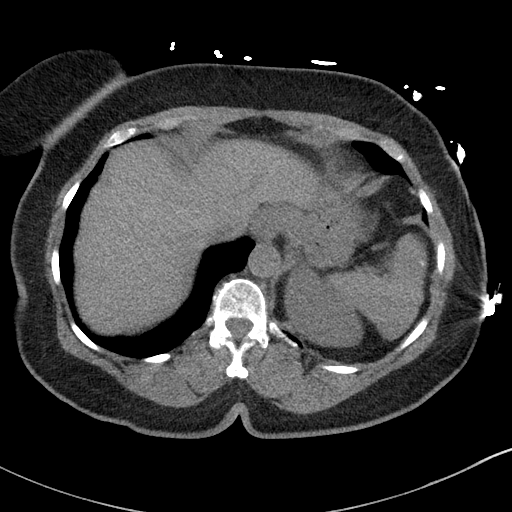
[im 69/72  soft-tissue]
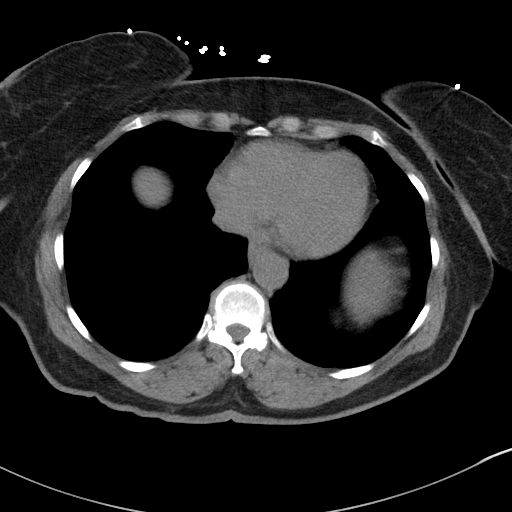

[Series 6: a/p w/o cor · coronal · non-contrast · 0.66mm/px · 3 of 151 slices shown]
[im 51/151  soft-tissue]
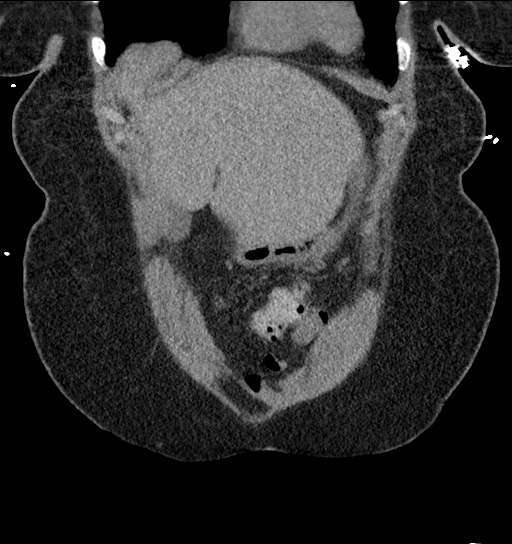
[im 67/151  soft-tissue]
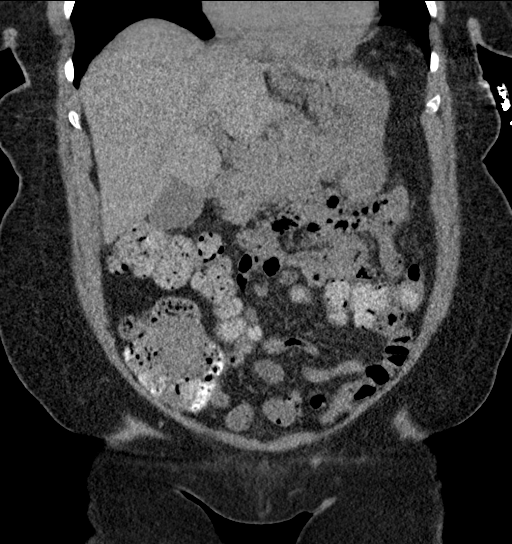
[im 84/151  soft-tissue]
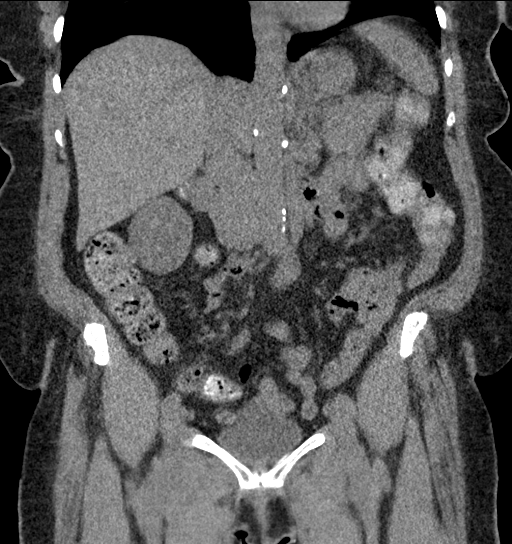

[17 of 46 positions shown; findings below may reference images not displayed]

FINDINGS: Lower chest: No acute abnormality.

Hepatobiliary: Cholelithiasis is identified without obvious wall
thickening. The liver is normal in appearance.

Pancreas: Unremarkable. No pancreatic ductal dilatation or
surrounding inflammatory changes.

Spleen: Normal in size without focal abnormality.

Adrenals/Urinary Tract: Adrenal glands are normal. No renal stones,
masses, hydronephrosis, or perinephric stranding. No ureterectasis
or ureteral stone.

Stomach/Bowel: The stomach and small bowel are normal. The colon
demonstrates moderate fecal loading proximally. No other
abnormalities. The appendix is best seen on coronal imaging without
appendicitis.

Vascular/Lymphatic: Atherosclerotic changes seen in the
nonaneurysmal aorta. No adenopathy.

Reproductive: Status post hysterectomy. No adnexal masses.

Other: There is a ventral hernia at the level of the pelvis
containing primarily fat and a small amount nonobstructive bowel.

Musculoskeletal: Grade 1 anterolisthesis of L4 versus L5. Mild
anterior wedging of T12, L1, and L3, stable since May 09, 2011.
IMPRESSION: 1. Cholelithiasis without gallbladder wall thickening or adjacent
fluid. An ultrasound could better evaluate if there is concern.
2. Moderate fecal loading in the proximal colon.
3. Atherosclerotic change in the nonaneurysmal abdominal aorta.
Ventral hernia as above primarily containing fat with a small amount
of nonobstructive bowel.

## 2019-12-12 ENCOUNTER — Other Ambulatory Visit: Payer: Self-pay | Admitting: Nurse Practitioner

## 2019-12-12 DIAGNOSIS — E876 Hypokalemia: Secondary | ICD-10-CM

## 2020-02-27 ENCOUNTER — Other Ambulatory Visit: Payer: Self-pay

## 2020-02-27 DIAGNOSIS — E785 Hyperlipidemia, unspecified: Secondary | ICD-10-CM

## 2020-02-27 DIAGNOSIS — I10 Essential (primary) hypertension: Secondary | ICD-10-CM

## 2020-02-27 MED ORDER — LOVASTATIN 40 MG PO TABS: ORAL_TABLET | ORAL | 1 refills | Status: DC

## 2020-02-27 MED ORDER — LOSARTAN POTASSIUM 100 MG PO TABS: ORAL_TABLET | ORAL | 1 refills | Status: DC

## 2020-02-27 MED ORDER — AMLODIPINE BESYLATE 10 MG PO TABS: 10.0000 mg | ORAL_TABLET | Freq: Every day | ORAL | 1 refills | Status: DC

## 2020-03-01 ENCOUNTER — Other Ambulatory Visit: Payer: Self-pay

## 2020-03-01 DIAGNOSIS — E876 Hypokalemia: Secondary | ICD-10-CM

## 2020-03-01 MED ORDER — POTASSIUM CHLORIDE ER 10 MEQ PO CPCR: ORAL_CAPSULE | ORAL | 0 refills | Status: DC

## 2020-03-09 ENCOUNTER — Other Ambulatory Visit: Payer: Self-pay | Admitting: Nurse Practitioner

## 2020-03-09 DIAGNOSIS — E876 Hypokalemia: Secondary | ICD-10-CM

## 2020-03-21 ENCOUNTER — Encounter: Payer: Self-pay | Admitting: Nurse Practitioner

## 2020-03-21 ENCOUNTER — Other Ambulatory Visit: Payer: Self-pay

## 2020-03-21 ENCOUNTER — Ambulatory Visit (INDEPENDENT_AMBULATORY_CARE_PROVIDER_SITE_OTHER): Payer: Medicare HMO

## 2020-03-21 ENCOUNTER — Ambulatory Visit (INDEPENDENT_AMBULATORY_CARE_PROVIDER_SITE_OTHER): Payer: Medicare HMO | Admitting: Nurse Practitioner

## 2020-03-21 VITALS — BP 112/70 | HR 75 | Temp 98.5°F | Ht 60.4 in | Wt 149.0 lb

## 2020-03-21 VITALS — BP 112/70 | HR 75 | Temp 98.5°F | Ht 60.4 in | Wt 149.4 lb

## 2020-03-21 DIAGNOSIS — E785 Hyperlipidemia, unspecified: Secondary | ICD-10-CM | POA: Diagnosis not present

## 2020-03-21 DIAGNOSIS — I10 Essential (primary) hypertension: Secondary | ICD-10-CM | POA: Diagnosis not present

## 2020-03-21 DIAGNOSIS — Z0000 Encounter for general adult medical examination without abnormal findings: Secondary | ICD-10-CM | POA: Diagnosis not present

## 2020-03-21 LAB — CMP14+EGFR
ALT: 13 IU/L (ref 0–32)
AST: 18 IU/L (ref 0–40)
Albumin/Globulin Ratio: 1.4 (ref 1.2–2.2)
Albumin: 4.2 g/dL (ref 3.6–4.6)
Alkaline Phosphatase: 110 IU/L (ref 48–121)
BUN/Creatinine Ratio: 10 — ABNORMAL LOW (ref 12–28)
BUN: 9 mg/dL (ref 8–27)
Bilirubin Total: 0.6 mg/dL (ref 0.0–1.2)
CO2: 23 mmol/L (ref 20–29)
Calcium: 9.6 mg/dL (ref 8.7–10.3)
Chloride: 107 mmol/L — ABNORMAL HIGH (ref 96–106)
Creatinine, Ser: 0.87 mg/dL (ref 0.57–1.00)
GFR calc Af Amer: 71 mL/min/{1.73_m2} (ref >59)
GFR calc non Af Amer: 62 mL/min/{1.73_m2} (ref >59)
Globulin, Total: 3.1 g/dL (ref 1.5–4.5)
Glucose: 108 mg/dL — ABNORMAL HIGH (ref 65–99)
Potassium: 3.5 mmol/L (ref 3.5–5.2)
Sodium: 142 mmol/L (ref 134–144)
Total Protein: 7.3 g/dL (ref 6.0–8.5)

## 2020-03-21 LAB — CBC
Hematocrit: 40 % (ref 34.0–46.6)
Hemoglobin: 13.9 g/dL (ref 11.1–15.9)
MCH: 33.6 pg — ABNORMAL HIGH (ref 26.6–33.0)
MCHC: 34.8 g/dL (ref 31.5–35.7)
MCV: 97 fL (ref 79–97)
Platelets: 277 10*3/uL (ref 150–450)
RBC: 4.14 x10E6/uL (ref 3.77–5.28)
RDW: 12.8 % (ref 11.7–15.4)
WBC: 5.8 10*3/uL (ref 3.4–10.8)

## 2020-03-21 LAB — POCT URINALYSIS DIPSTICK
Bilirubin, UA: NEGATIVE
Glucose, UA: NEGATIVE
Nitrite, UA: NEGATIVE
Protein, UA: POSITIVE — AB
Spec Grav, UA: 1.02 (ref 1.010–1.025)
Urobilinogen, UA: 0.2 E.U./dL
pH, UA: 6 (ref 5.0–8.0)

## 2020-03-21 LAB — POCT UA - MICROALBUMIN
Albumin/Creatinine Ratio, Urine, POC: 300
Creatinine, POC: 300 mg/dL
Microalbumin Ur, POC: 150 mg/L

## 2020-03-21 LAB — LIPID PANEL
Chol/HDL Ratio: 3 ratio (ref 0.0–4.4)
Cholesterol, Total: 139 mg/dL (ref 100–199)
HDL: 47 mg/dL (ref >39)
LDL Chol Calc (NIH): 75 mg/dL (ref 0–99)
Triglycerides: 86 mg/dL (ref 0–149)
VLDL Cholesterol Cal: 17 mg/dL (ref 5–40)

## 2020-03-21 NOTE — Patient Instructions (Signed)
Health Maintenance, Female Adopting a healthy lifestyle and getting preventive care are important in promoting health and wellness. Ask your health care provider about:  The right schedule for you to have regular tests and exams.  Things you can do on your own to prevent diseases and keep yourself healthy. What should I know about diet, weight, and exercise? Eat a healthy diet   Eat a diet that includes plenty of vegetables, fruits, low-fat dairy products, and lean protein.  Do not eat a lot of foods that are high in solid fats, added sugars, or sodium. Maintain a healthy weight Body mass index (BMI) is used to identify weight problems. It estimates body fat based on height and weight. Your health care provider can help determine your BMI and help you achieve or maintain a healthy weight. Get regular exercise Get regular exercise. This is one of the most important things you can do for your health. Most adults should:  Exercise for at least 150 minutes each week. The exercise should increase your heart rate and make you sweat (moderate-intensity exercise).  Do strengthening exercises at least twice a week. This is in addition to the moderate-intensity exercise.  Spend less time sitting. Even light physical activity can be beneficial. Watch cholesterol and blood lipids Have your blood tested for lipids and cholesterol at 83 years of age, then have this test every 5 years. Have your cholesterol levels checked more often if:  Your lipid or cholesterol levels are high.  You are older than 83 years of age.  You are at high risk for heart disease. What should I know about cancer screening? Depending on your health history and family history, you may need to have cancer screening at various ages. This may include screening for:  Breast cancer.  Cervical cancer.  Colorectal cancer.  Skin cancer.  Lung cancer. What should I know about heart disease, diabetes, and high blood  pressure? Blood pressure and heart disease  High blood pressure causes heart disease and increases the risk of stroke. This is more likely to develop in people who have high blood pressure readings, are of African descent, or are overweight.  Have your blood pressure checked: ? Every 3-5 years if you are 18-39 years of age. ? Every year if you are 40 years old or older. Diabetes Have regular diabetes screenings. This checks your fasting blood sugar level. Have the screening done:  Once every three years after age 40 if you are at a normal weight and have a low risk for diabetes.  More often and at a younger age if you are overweight or have a high risk for diabetes. What should I know about preventing infection? Hepatitis B If you have a higher risk for hepatitis B, you should be screened for this virus. Talk with your health care provider to find out if you are at risk for hepatitis B infection. Hepatitis C Testing is recommended for:  Everyone born from 1945 through 1965.  Anyone with known risk factors for hepatitis C. Sexually transmitted infections (STIs)  Get screened for STIs, including gonorrhea and chlamydia, if: ? You are sexually active and are younger than 83 years of age. ? You are older than 83 years of age and your health care provider tells you that you are at risk for this type of infection. ? Your sexual activity has changed since you were last screened, and you are at increased risk for chlamydia or gonorrhea. Ask your health care provider if   you are at risk.  Ask your health care provider about whether you are at high risk for HIV. Your health care provider may recommend a prescription medicine to help prevent HIV infection. If you choose to take medicine to prevent HIV, you should first get tested for HIV. You should then be tested every 3 months for as long as you are taking the medicine. Pregnancy  If you are about to stop having your period (premenopausal) and  you may become pregnant, seek counseling before you get pregnant.  Take 400 to 800 micrograms (mcg) of folic acid every day if you become pregnant.  Ask for birth control (contraception) if you want to prevent pregnancy. Osteoporosis and menopause Osteoporosis is a disease in which the bones lose minerals and strength with aging. This can result in bone fractures. If you are 65 years old or older, or if you are at risk for osteoporosis and fractures, ask your health care provider if you should:  Be screened for bone loss.  Take a calcium or vitamin D supplement to lower your risk of fractures.  Be given hormone replacement therapy (HRT) to treat symptoms of menopause. Follow these instructions at home: Lifestyle  Do not use any products that contain nicotine or tobacco, such as cigarettes, e-cigarettes, and chewing tobacco. If you need help quitting, ask your health care provider.  Do not use street drugs.  Do not share needles.  Ask your health care provider for help if you need support or information about quitting drugs. Alcohol use  Do not drink alcohol if: ? Your health care provider tells you not to drink. ? You are pregnant, may be pregnant, or are planning to become pregnant.  If you drink alcohol: ? Limit how much you use to 0-1 drink a day. ? Limit intake if you are breastfeeding.  Be aware of how much alcohol is in your drink. In the [place] one drink equals one 12 oz bottle of beer (355 mL), one 5 oz glass of wine (148 mL), or one 1 oz glass of hard liquor (44 mL). General instructions  Schedule regular health, dental, and eye exams.  Stay current with your vaccines.  Tell your health care provider if: ? You often feel depressed. ? You have ever been abused or do not feel safe at home. Summary  Adopting a healthy lifestyle and getting preventive care are important in promoting health and wellness.  Follow your health care provider's instructions about healthy  diet, exercising, and getting tested or screened for diseases.  Follow your health care provider's instructions on monitoring your cholesterol and blood pressure. This information is not intended to replace advice given to you by your health care provider. Make sure you discuss any questions you have with your health care provider. Document Revised: 07/06/2018 Document Reviewed: 07/06/2018 Elsevier Patient Education  2020 [employer]  

## 2020-03-21 NOTE — Patient Instructions (Signed)
[name] , Thank you for taking time to come for your Medicare Wellness Visit. I appreciate your ongoing commitment to your health goals. Please review the following plan we discussed and let me know if I can assist you in the future.   Screening recommendations/referrals: Colonoscopy: not required Mammogram: not required Bone Density: completed 12/07/2008 Recommended yearly ophthalmology/optometry visit for glaucoma screening and checkup Recommended yearly dental visit for hygiene and checkup  Vaccinations: Influenza vaccine: decline Pneumococcal vaccine: decline Tdap vaccine: decline Shingles vaccine: decline   Covid-19: will call with dates  Advanced directives: Advance directive discussed with you today. Even though you declined this today please call our office should you change your mind and we can give you the proper paperwork for you to fill out.  Conditions/risks identified: none  Next appointment: 09/26/2020 at 9:15  Follow up in one year for your annual wellness visit    Preventive Care 65 Years and Older, Female Preventive care refers to lifestyle choices and visits with your health care provider that can promote health and wellness. What does preventive care include?  A yearly physical exam. This is also called an annual well check.  Dental exams once or twice a year.  Routine eye exams. Ask your health care provider how often you should have your eyes checked.  Personal lifestyle choices, including:  Daily care of your teeth and gums.  Regular physical activity.  Eating a healthy diet.  Avoiding tobacco and drug use.  Limiting alcohol use.  Practicing safe sex.  Taking low-dose aspirin every day.  Taking vitamin and mineral supplements as recommended by your health care provider. What happens during an annual well check? The services and screenings done by your health care provider during your annual well check will depend on your age, overall  health, lifestyle risk factors, and family history of disease. Counseling  Your health care provider may ask you questions about your:  Alcohol use.  Tobacco use.  Drug use.  Emotional well-being.  Home and relationship well-being.  Sexual activity.  Eating habits.  History of falls.  Memory and ability to understand (cognition).  Work and work Astronomer.  Reproductive health. Screening  You may have the following tests or measurements:  Height, weight, and BMI.  Blood pressure.  Lipid and cholesterol levels. These may be checked every 5 years, or more frequently if you are over 24 years old.  Skin check.  Lung cancer screening. You may have this screening every year starting at age 27 if you have a 30-pack-year history of smoking and currently smoke or have quit within the past 15 years.  Fecal occult blood test (FOBT) of the stool. You may have this test every year starting at age 69.  Flexible sigmoidoscopy or colonoscopy. You may have a sigmoidoscopy every 5 years or a colonoscopy every 10 years starting at age 28.  Hepatitis C blood test.  Hepatitis B blood test.  Sexually transmitted disease (STD) testing.  Diabetes screening. This is done by checking your blood sugar (glucose) after you have not eaten for a while (fasting). You may have this done every 1-3 years.  Bone density scan. This is done to screen for osteoporosis. You may have this done starting at age 83.  Mammogram. This may be done every 1-2 years. Talk to your health care provider about how often you should have regular mammograms. Talk with your health care provider about your test results, treatment options, and if necessary, the need for more tests.  Vaccines  Your health care provider may recommend certain vaccines, such as:  Influenza vaccine. This is recommended every year.  Tetanus, diphtheria, and acellular pertussis (Tdap, Td) vaccine. You may need a Td booster every 10  years.  Zoster vaccine. You may need this after age 11.  Pneumococcal 13-valent conjugate (PCV13) vaccine. One dose is recommended after age 54.  Pneumococcal polysaccharide (PPSV23) vaccine. One dose is recommended after age 33. Talk to your health care provider about which screenings and vaccines you need and how often you need them. This information is not intended to replace advice given to you by your health care provider. Make sure you discuss any questions you have with your health care provider. Document Released: 08/09/2015 Document Revised: 04/01/2016 Document Reviewed: 05/14/2015 Elsevier Interactive Patient Education  2017 [employer] Prevention in the Home Falls can cause injuries. They can happen to people of all ages. There are many things you can do to make your home safe and to help prevent falls. What can I do on the outside of my home?  Regularly fix the edges of walkways and driveways and fix any cracks.  Remove anything that might make you trip as you walk through a door, such as a raised step or threshold.  Trim any bushes or trees on the path to your home.  Use bright outdoor lighting.  Clear any walking paths of anything that might make someone trip, such as rocks or tools.  Regularly check to see if handrails are loose or broken. Make sure that both sides of any steps have handrails.  Any raised decks and porches should have guardrails on the edges.  Have any leaves, snow, or ice cleared regularly.  Use sand or salt on walking paths during winter.  Clean up any spills in your garage right away. This includes oil or grease spills. What can I do in the bathroom?  Use night lights.  Install grab bars by the toilet and in the tub and shower. Do not use towel bars as grab bars.  Use non-skid mats or decals in the tub or shower.  If you need to sit down in the shower, use a plastic, non-slip stool.  Keep the floor dry. Clean up any water that  spills on the floor as soon as it happens.  Remove soap buildup in the tub or shower regularly.  Attach bath mats securely with double-sided non-slip rug tape.  Do not have throw rugs and other things on the floor that can make you trip. What can I do in the bedroom?  Use night lights.  Make sure that you have a light by your bed that is easy to reach.  Do not use any sheets or blankets that are too big for your bed. They should not hang down onto the floor.  Have a firm chair that has side arms. You can use this for support while you get dressed.  Do not have throw rugs and other things on the floor that can make you trip. What can I do in the kitchen?  Clean up any spills right away.  Avoid walking on wet floors.  Keep items that you use a lot in easy-to-reach places.  If you need to reach something above you, use a strong step stool that has a grab bar.  Keep electrical cords out of the way.  Do not use floor polish or wax that makes floors slippery. If you must use wax, use non-skid floor wax.  Do not have throw rugs and other things on the floor that can make you trip. What can I do with my stairs?  Do not leave any items on the stairs.  Make sure that there are handrails on both sides of the stairs and use them. Fix handrails that are broken or loose. Make sure that handrails are as long as the stairways.  Check any carpeting to make sure that it is firmly attached to the stairs. Fix any carpet that is loose or worn.  Avoid having throw rugs at the top or bottom of the stairs. If you do have throw rugs, attach them to the floor with carpet tape.  Make sure that you have a light switch at the top of the stairs and the bottom of the stairs. If you do not have them, ask someone to add them for you. What else can I do to help prevent falls?  Wear shoes that:  Do not have high heels.  Have rubber bottoms.  Are comfortable and fit you well.  Are closed at the  toe. Do not wear sandals.  If you use a stepladder:  Make sure that it is fully opened. Do not climb a closed stepladder.  Make sure that both sides of the stepladder are locked into place.  Ask someone to hold it for you, if possible.  Clearly mark and make sure that you can see:  Any grab bars or handrails.  First and last steps.  Where the edge of each step is.  Use tools that help you move around (mobility aids) if they are needed. These include:  Canes.  Walkers.  Scooters.  Crutches.  Turn on the lights when you go into a dark area. Replace any light bulbs as soon as they burn out.  Set up your furniture so you have a clear path. Avoid moving your furniture around.  If any of your floors are uneven, fix them.  If there are any pets around you, be aware of where they are.  Review your medicines with your doctor. Some medicines can make you feel dizzy. This can increase your chance of falling. Ask your doctor what other things that you can do to help prevent falls. This information is not intended to replace advice given to you by your health care provider. Make sure you discuss any questions you have with your health care provider. Document Released: 05/09/2009 Document Revised: 12/19/2015 Document Reviewed: 08/17/2014 Elsevier Interactive Patient Education  2017 [employer].

## 2020-03-21 NOTE — Progress Notes (Signed)
This visit occurred during the SARS-CoV-2 public health emergency.  Safety protocols were in place, including screening questions prior to the visit, additional usage of staff PPE, and extensive cleaning of exam room while observing appropriate contact time as indicated for disinfecting solutions.  Subjective:   Tara Collins is a 85 y.o. female who presents for Medicare Annual (Subsequent) preventive examination.  Review of Systems     Cardiac Risk Factors include: advanced age (>47men, >45 women);dyslipidemia;hypertension     Objective:    Today's Vitals   03/21/20 1031  BP: 112/70  Pulse: 75  Temp: 98.5 F (36.9 C)  TempSrc: Oral  Weight: 149 lb (67.6 kg)  Height: 5' 0.4" (1.534 m)   Body mass index is 28.72 kg/m.  Advanced Directives 03/21/2020 03/16/2019 07/26/2018 07/05/2018 06/28/2018  Does Patient Have a Medical Advance Directive? No No No No No  Would patient like information on creating a medical advance directive? - - No - Patient declined No - Patient declined No - Patient declined    Current Medications (verified) Outpatient Encounter Medications as of 03/21/2020  Medication Sig  . amLODipine (NORVASC) 10 MG tablet Take 1 tablet (10 mg total) by mouth daily.  Marland Kitchen aspirin EC 325 MG tablet Take 1 tablet (325 mg total) by mouth 2 (two) times daily.  . carboxymethylcellulose (REFRESH PLUS) 0.5 % SOLN Place 1 drop into both eyes daily as needed (dry eyes).  Marland Kitchen diclofenac Sodium (VOLTAREN) 1 % GEL Apply 2 g topically 4 (four) times daily.  Marland Kitchen losartan (COZAAR) 100 MG tablet TAKE 1 TABLET(100 MG) BY MOUTH DAILY  . lovastatin (MEVACOR) 40 MG tablet TAKE 1 TABLET(40 MG) BY MOUTH DAILY  . potassium chloride (MICRO-K) 10 MEQ CR capsule TAKE 1 CAPSULE(10 MEQ) BY MOUTH DAILY  . traZODone (DESYREL) 50 MG tablet TAKE 1 TABLET(50 MG) BY MOUTH AT BEDTIME AS NEEDED FOR SLEEP   No facility-administered encounter medications on file as of 03/21/2020.    Allergies (verified) Contrast  media [iodinated diagnostic agents] and Latex   History: Past Medical History:  Diagnosis Date  . Arthritis   . Hepatitis B 03/1963  . Hypertension   . PONV (postoperative nausea and vomiting)   . Primary localized osteoarthritis of right knee 07/05/2018  . Seasonal allergies    Past Surgical History:  Procedure Laterality Date  . ABDOMINAL HYSTERECTOMY  01/1991  . APPENDECTOMY    . CATARACT EXTRACTION, BILATERAL    . CHOLECYSTECTOMY  10/2017  . PARTIAL KNEE ARTHROPLASTY Right 07/05/2018   Procedure: UNICOMPARTMENTAL KNEE;  Surgeon: Teryl Disa, MD;  Location: [facility];  Service: Orthopedics;  Laterality: Right;  . REPLACEMENT TOTAL KNEE Left 2012   History reviewed. No pertinent family history. Social History   Socioeconomic History  . Marital status: Single    Spouse name: Not on file  . Number of children: Not on file  . Years of education: Not on file  . Highest education level: Not on file  Occupational History  . Occupation: retired  Tobacco Use  . Smoking status: Never Smoker  . Smokeless tobacco: Never Used  Vaping Use  . Vaping Use: Never used  Substance and Sexual Activity  . Alcohol use: Not Currently  . Drug use: Not Currently  . Sexual activity: Not Currently  Other Topics Concern  . Not on file  Social History Narrative  . Not on file   Social Determinants of Health   Financial Resource Strain: Low Risk   . Difficulty of Paying Living  Expenses: Not hard at all  Food Insecurity: No Food Insecurity  . Worried About Programme researcher, broadcasting/film/videounning Out of Food in the Last Year: Never true  . Ran Out of Food in the Last Year: Never true  Transportation Needs: No Transportation Needs  . Lack of Transportation (Medical): No  . Lack of Transportation (Non-Medical): No  Physical Activity: Inactive  . Days of Exercise per Week: 0 days  . Minutes of Exercise per Session: 0 min  Stress: No Stress Concern Present  . Feeling of Stress : Not at all  Social Connections:   .  Frequency of Communication with Friends and Family: Not on file  . Frequency of Social Gatherings with Friends and Family: Not on file  . Attends Religious Services: Not on file  . Active Member of Clubs or Organizations: Not on file  . Attends BankerClub or Organization Meetings: Not on file  . Marital Status: Not on file    Tobacco Counseling Counseling given: Not Answered   Clinical Intake:  Pre-visit preparation completed: Yes  Pain : No/denies pain     Nutritional Status: BMI 25 -29 Overweight Nutritional Risks: None Diabetes: No  How often do you need to have someone help you when you read instructions, pamphlets, or other written materials from your doctor or pharmacy?: 1 - Never What is the last grade level you completed in school?: 12th grade  Diabetic? no  Interpreter Needed?: No  Information entered by :: NAllen LPN   Activities of Daily Living In your present state of health, do you have any difficulty performing the following activities: 03/21/2020 03/21/2020  Hearing? N N  Vision? N N  Difficulty concentrating or making decisions? N N  Walking or climbing stairs? Y Y  Comment due to knees -  Dressing or bathing? N Y  Doing errands, shopping? N N  Preparing Food and eating ? N -  Using the Toilet? N -  In the past six months, have you accidently leaked urine? N -  Do you have problems with loss of bowel control? N -  Managing your Medications? N -  Managing your Finances? N -  Housekeeping or managing your Housekeeping? N -  Some recent data might be hidden    Patient Care Team: Arnette FeltsMoore, Janece, FNP as PCP - General (General Practice)  Indicate any recent Medical Services you may have received from other than [facility] providers in the past year (date may be approximate).     Assessment:   This is a routine wellness examination for Tara Collins.  Hearing/Vision screen  Hearing Screening   125Hz  250Hz  500Hz  1000Hz  2000Hz  3000Hz  4000Hz  6000Hz  8000Hz   Right ear:            Left ear:           Vision Screening Comments: No regular eye exams,   Dietary issues and exercise activities discussed: Current Exercise Habits: The patient has a physically strenuous job, but has no regular exercise apart from work.  Goals    . patient state     03/16/2019, to eat better    . Patient Stated     03/21/2020,  keep BP down, eat healthy      Depression Screen PHQ 2/9 Scores 03/21/2020 03/21/2020 03/16/2019 06/20/2018  PHQ - 2 Score 0 0 0 0  PHQ- 9 Score - - 1 -    Fall Risk Fall Risk  03/21/2020 03/21/2020 03/16/2019 06/20/2018  Falls in the past year? 0 0 0 0  Risk for  fall due to : Medication side effect;Impaired balance/gait - Medication side effect -  Follow up Falls evaluation completed;Education provided;Falls prevention discussed - Falls evaluation completed;Education provided;Falls prevention discussed -    Any stairs in or around the home? No  If so, are there any without handrails? n/a Home free of loose throw rugs in walkways, pet beds, electrical cords, etc? Yes  Adequate lighting in your home to reduce risk of falls? Yes   ASSISTIVE DEVICES UTILIZED TO PREVENT FALLS:  Life alert? No  Use of a cane, walker or w/c? Yes  Grab bars in the bathroom? No  Shower chair or bench in shower? No  Elevated toilet seat or a handicapped toilet? No   TIMED UP AND GO:  Was the test performed? No .  Gait slow and steady with assistive device  Cognitive Function:     6CIT Screen 03/21/2020 03/16/2019  What Year? 0 points 0 points  What month? 0 points 0 points  What time? 0 points 0 points  Count back from 20 0 points 0 points  Months in reverse 2 points 0 points  Repeat phrase 6 points 0 points  Total Score 8 0    Immunizations Immunization History  Administered Date(s) Administered  . Influenza Whole 07/27/2006  . Pneumococcal Polysaccharide-23 11/29/2008    TDAP status: Decline Flu Vaccine status: Declined, Education has been provided  regarding the importance of this vaccine but patient still declined. Advised may receive this vaccine at local pharmacy or Health Dept. Aware to provide a copy of the vaccination record if obtained from local pharmacy or Health Dept. Verbalized acceptance and understanding. Pneumococcal vaccine status: Declined,  Education has been provided regarding the importance of this vaccine but patient still declined. Advised may receive this vaccine at local pharmacy or Health Dept. Aware to provide a copy of the vaccination record if obtained from local pharmacy or Health Dept. Verbalized acceptance and understanding.  Covid-19 vaccine status: Completed vaccines  Qualifies for Shingles Vaccine? Yes   Zostavax completed No   Shingrix Completed?: No.    Education has been provided regarding the importance of this vaccine. Patient has been advised to call insurance company to determine out of pocket expense if they have not yet received this vaccine. Advised may also receive vaccine at local pharmacy or Health Dept. Verbalized acceptance and understanding.  Screening Tests Health Maintenance  Topic Date Due  . COVID-19 Vaccine (1) Never done  . TETANUS/TDAP  10/22/2020 (Originally 01/12/1954)  . PNA vac Low Risk Adult (2 of 2 - PCV13) 10/22/2020 (Originally 11/29/2009)  . INFLUENZA VACCINE  10/24/2020 (Originally 02/25/2020)  . DEXA SCAN  Completed    Health Maintenance  Health Maintenance Due  Topic Date Due  . COVID-19 Vaccine (1) Never done    Colorectal cancer screening: No longer required.  Mammogram status: No longer required.  Bone Density status: Completed 12/07/2008.   Lung Cancer Screening: (Low Dose CT Chest recommended if Age 58-80 years, 30 pack-year currently smoking OR have quit w/in 15years.) does not qualify.   Lung Cancer Screening Referral: no  Additional Screening:  Hepatitis C Screening: does not qualify;  Vision Screening: Recommended annual ophthalmology exams for early  detection of glaucoma and other disorders of the eye. Is the patient up to date with their annual eye exam?  No  Who is the provider or what is the name of the office in which the patient attends annual eye exams? Does not have one If pt is not  established with a provider, would they like to be referred to a provider to establish care? No .   Dental Screening: Recommended annual dental exams for proper oral hygiene  Community Resource Referral / Chronic Care Management: CRR required this visit?  No   CCM required this visit?  No      Plan:     I have personally reviewed and noted the following in the patient's chart:   . Medical and social history . Use of alcohol, tobacco or illicit drugs  . Current medications and supplements . Functional ability and status . Nutritional status . Physical activity . Advanced directives . List of other physicians . Hospitalizations, surgeries, and ER visits in previous 12 months . Vitals . Screenings to include cognitive, depression, and falls . Referrals and appointments  In addition, I have reviewed and discussed with patient certain preventive protocols, quality metrics, and best practice recommendations. A written personalized care plan for preventive services as well as general preventive health recommendations were provided to patient.     Barb Merino, LPN   3/50/[id number]   Nurse Notes:

## 2020-03-21 NOTE — Progress Notes (Signed)
I,Yamilka Roman [employer] as a Education administrator for [employer], FNP.,have documented all relevant documentation on the behalf of Minette Brine, FNP,as directed by  Minette Brine, FNP while in the presence of Minette Brine, [place].   This visit occurred during the SARS-CoV-2 public health emergency.  Safety protocols were in place, including screening questions prior to the visit, additional usage of staff PPE, and extensive cleaning of exam room while observing appropriate contact time as indicated for disinfecting solutions.  Subjective:     Patient ID: Tara Collins , female    DOB: Mar 19, 1935 , 85 y.o.   MRN: [id number]   Chief Complaint  Patient presents with  . Annual Exam    HPI  Here for HM. She also had her Medicare AWV with [facility]  She tells me her last covid vaccine was march 10th.    Past Medical History:  Diagnosis Date  . Arthritis   . Hepatitis B 03/1963  . Hypertension   . PONV (postoperative nausea and vomiting)   . Primary localized osteoarthritis of right knee 07/05/2018  . Seasonal allergies      History reviewed. No pertinent family history.   Current Outpatient Medications:  .  amLODipine (NORVASC) 10 MG tablet, Take 1 tablet (10 mg total) by mouth daily., Disp: 90 tablet, Rfl: 1 .  aspirin EC 325 MG tablet, Take 1 tablet (325 mg total) by mouth 2 (two) times daily., Disp: 60 tablet, Rfl: 0 .  carboxymethylcellulose (REFRESH PLUS) 0.5 % SOLN, Place 1 drop into both eyes daily as needed (dry eyes)., Disp: , Rfl:  .  diclofenac Sodium (VOLTAREN) 1 % GEL, Apply 2 g topically 4 (four) times daily., Disp: 100 g, Rfl: 1 .  losartan (COZAAR) 100 MG tablet, TAKE 1 TABLET(100 MG) BY MOUTH DAILY, Disp: 90 tablet, Rfl: 1 .  lovastatin (MEVACOR) 40 MG tablet, TAKE 1 TABLET(40 MG) BY MOUTH DAILY, Disp: 90 tablet, Rfl: 1 .  potassium chloride (MICRO-K) 10 MEQ CR capsule, TAKE 1 CAPSULE(10 MEQ) BY MOUTH DAILY, Disp: 90 capsule, Rfl: 0 .  traZODone (DESYREL) 50 MG tablet, TAKE 1  TABLET(50 MG) BY MOUTH AT BEDTIME AS NEEDED FOR SLEEP, Disp: 30 tablet, Rfl: 3   Allergies  Allergen Reactions  . Contrast Media [Iodinated Diagnostic Agents] Shortness Of Breath    ALLERGY ADDED FROM QUESTIONING PT  SHE HAD A PREVIOUS REACTION TO CONTRAST   . Latex Rash    "skin burns"      The patient states she is status post hysterectomy.  No LMP recorded. Patient has had a hysterectomy.. . Negative for: breast discharge, breast lump(s), breast pain and breast self exam. Associated symptoms include abnormal vaginal bleeding. Pertinent negatives include abnormal bleeding (hematology), anxiety, decreased libido, depression, difficulty falling sleep, dyspareunia, history of infertility, nocturia, sexual dysfunction, sleep disturbances, urinary incontinence, urinary urgency, vaginal discharge and vaginal itching. Diet regular. The patient states her exercise level is mainly at her daughters daycare.    . The patient's tobacco use is:  Social History   Tobacco Use  Smoking Status Never Smoker  Smokeless Tobacco Never Used   She has been exposed to passive smoke. The patient's alcohol use is:  Social History   Substance and Sexual Activity  Alcohol Use Not Currently     Review of Systems  Constitutional: Negative.   HENT: Negative.   Eyes: Negative.   Respiratory: Negative.  Negative for cough.   Cardiovascular: Negative.   Gastrointestinal: Negative.   Endocrine: Negative.   Genitourinary:  Negative.   Musculoskeletal: Negative.   Skin: Negative.   Allergic/Immunologic: Negative.   Neurological: Negative.   Hematological: Negative.   Psychiatric/Behavioral: Negative.      Today's Vitals   03/21/20 1021  BP: 112/70  Pulse: 75  Temp: 98.5 F (36.9 C)  TempSrc: Oral  Weight: 149 lb 6.4 oz (67.8 kg)  Height: 5' 0.4" (1.534 m)  PainSc: 0-No pain   Body mass index is 28.79 kg/m.   Objective:  Physical Exam Constitutional:      General: She is not in acute  distress.    Appearance: Normal appearance. She is well-developed. She is obese.  HENT:     Head: Normocephalic and atraumatic.     Right Ear: Hearing, tympanic membrane, ear canal and external ear normal. There is no impacted cerumen.     Left Ear: Hearing, tympanic membrane, ear canal and external ear normal. There is no impacted cerumen.     Nose:     Comments: Deferred - masked    Mouth/Throat:     Comments: Deferred - masked Eyes:     General: Lids are normal.     Extraocular Movements: Extraocular movements intact.     Conjunctiva/sclera: Conjunctivae normal.     Pupils: Pupils are equal, round, and reactive to light.     Funduscopic exam:    Right eye: No papilledema.        Left eye: No papilledema.  Neck:     Thyroid: No thyroid mass.     Vascular: No carotid bruit.  Cardiovascular:     Rate and Rhythm: Normal rate and regular rhythm.     Pulses: Normal pulses.     Heart sounds: Normal heart sounds. No murmur heard.   Pulmonary:     Effort: Pulmonary effort is normal.     Breath sounds: Normal breath sounds.  Abdominal:     General: Abdomen is flat. Bowel sounds are normal. There is no distension.     Palpations: Abdomen is soft.     Tenderness: There is no abdominal tenderness.  Genitourinary:    Rectum: Guaiac result negative.  Musculoskeletal:        General: No swelling. Normal range of motion.     Cervical back: Full passive range of motion without pain, normal range of motion and neck supple.     Right lower leg: No edema.     Left lower leg: No edema.  Skin:    General: Skin is warm and dry.     Capillary Refill: Capillary refill takes less than 2 seconds.  Neurological:     General: No focal deficit present.     Mental Status: She is alert and oriented to person, place, and time.     Cranial Nerves: No cranial nerve deficit.     Sensory: No sensory deficit.  Psychiatric:        Mood and Affect: Mood normal.        Behavior: Behavior normal.         Thought Content: Thought content normal.        Judgment: Judgment normal.         Assessment And Plan:     1. Encounter for general adult medical examination w/o abnormal findings . Behavior modifications discussed and diet history reviewed.   . Pt will continue to exercise regularly and modify diet with low GI, plant based foods and decrease intake of processed foods.  . Recommend intake of daily multivitamin, Vitamin D, and  calcium.  . Recommend for preventive screenings (she had her last covid vaccine in March but does not remember the first dose date) as well as recommend immunizations that include influenza, TDAP (up to date)  2. Essential hypertension . B/P is well controlled.  . CMP ordered to check renal function.  . The importance of regular exercise and dietary modification was stressed to the patient.  . Stressed importance of losing ten percent of her body weight to help with B/P control.  . The weight loss would help with decreasing cardiac and cancer risk as well.  . EKG done with NSR HR 77 - POCT Urinalysis Dipstick (81002) - POCT UA - Microalbumin - EKG 12-Lead - CMP14+EGFR - CBC  3. Dyslipidemia  Chronic, controlled  no current medications, diet and exercise  - Lipid panel     Patient was given opportunity to ask questions. Patient verbalized understanding of the plan and was able to repeat key elements of the plan. All questions were answered to their satisfaction.   Minette Brine, FNP   I, Minette Brine, FNP, have reviewed all documentation for this visit. The documentation on 04/21/20 for the exam, diagnosis, procedures, and orders are all accurate and complete.  THE PATIENT IS ENCOURAGED TO PRACTICE SOCIAL DISTANCING DUE TO THE COVID-19 PANDEMIC.

## 2020-04-13 DIAGNOSIS — H40053 Ocular hypertension, bilateral: Secondary | ICD-10-CM | POA: Diagnosis not present

## 2020-04-13 DIAGNOSIS — H43393 Other vitreous opacities, bilateral: Secondary | ICD-10-CM | POA: Diagnosis not present

## 2020-05-15 ENCOUNTER — Other Ambulatory Visit: Payer: Self-pay | Admitting: Nurse Practitioner

## 2020-05-15 DIAGNOSIS — E876 Hypokalemia: Secondary | ICD-10-CM

## 2020-07-28 ENCOUNTER — Other Ambulatory Visit: Payer: Self-pay | Admitting: Nurse Practitioner

## 2020-07-28 DIAGNOSIS — E876 Hypokalemia: Secondary | ICD-10-CM

## 2020-08-30 ENCOUNTER — Other Ambulatory Visit: Payer: Self-pay | Admitting: Nurse Practitioner

## 2020-08-30 DIAGNOSIS — G4700 Insomnia, unspecified: Secondary | ICD-10-CM

## 2020-09-07 ENCOUNTER — Other Ambulatory Visit: Payer: Self-pay | Admitting: Nurse Practitioner

## 2020-09-07 DIAGNOSIS — G4700 Insomnia, unspecified: Secondary | ICD-10-CM

## 2020-09-08 IMAGING — DX DG KNEE 1-2V PORT*R*
2 series · 2 of 2 positions shown · non-contrast
Comparison: None.

CLINICAL DATA: Post partial right knee replacement

EXAM:
PORTABLE RIGHT KNEE - 1-2 VIEW

[knee ap]
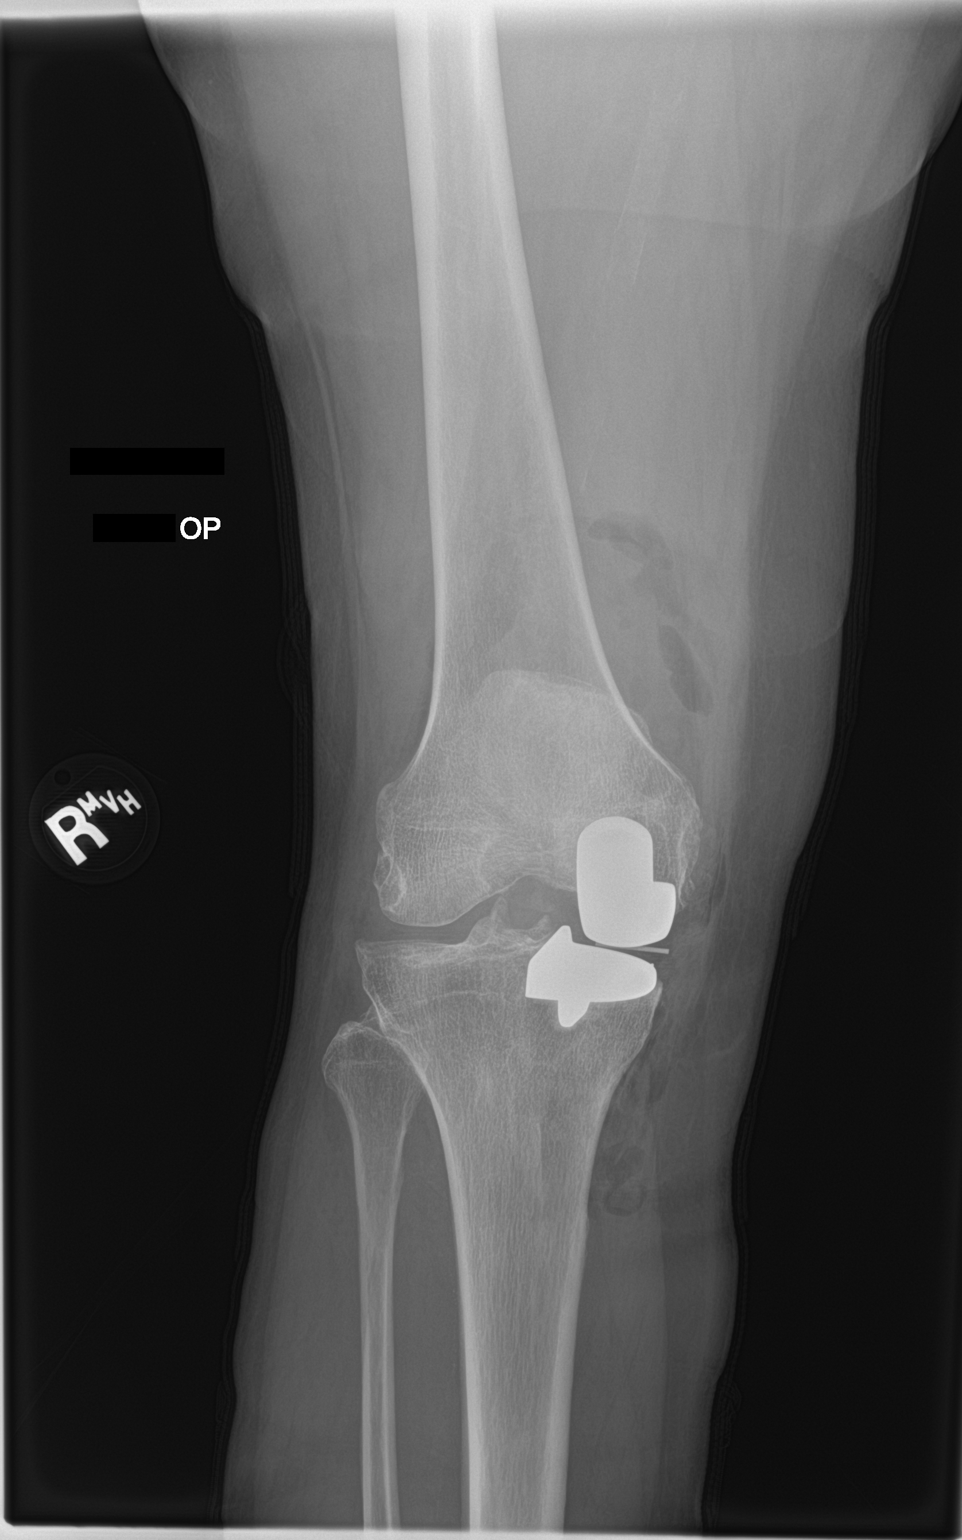

[knee lat]
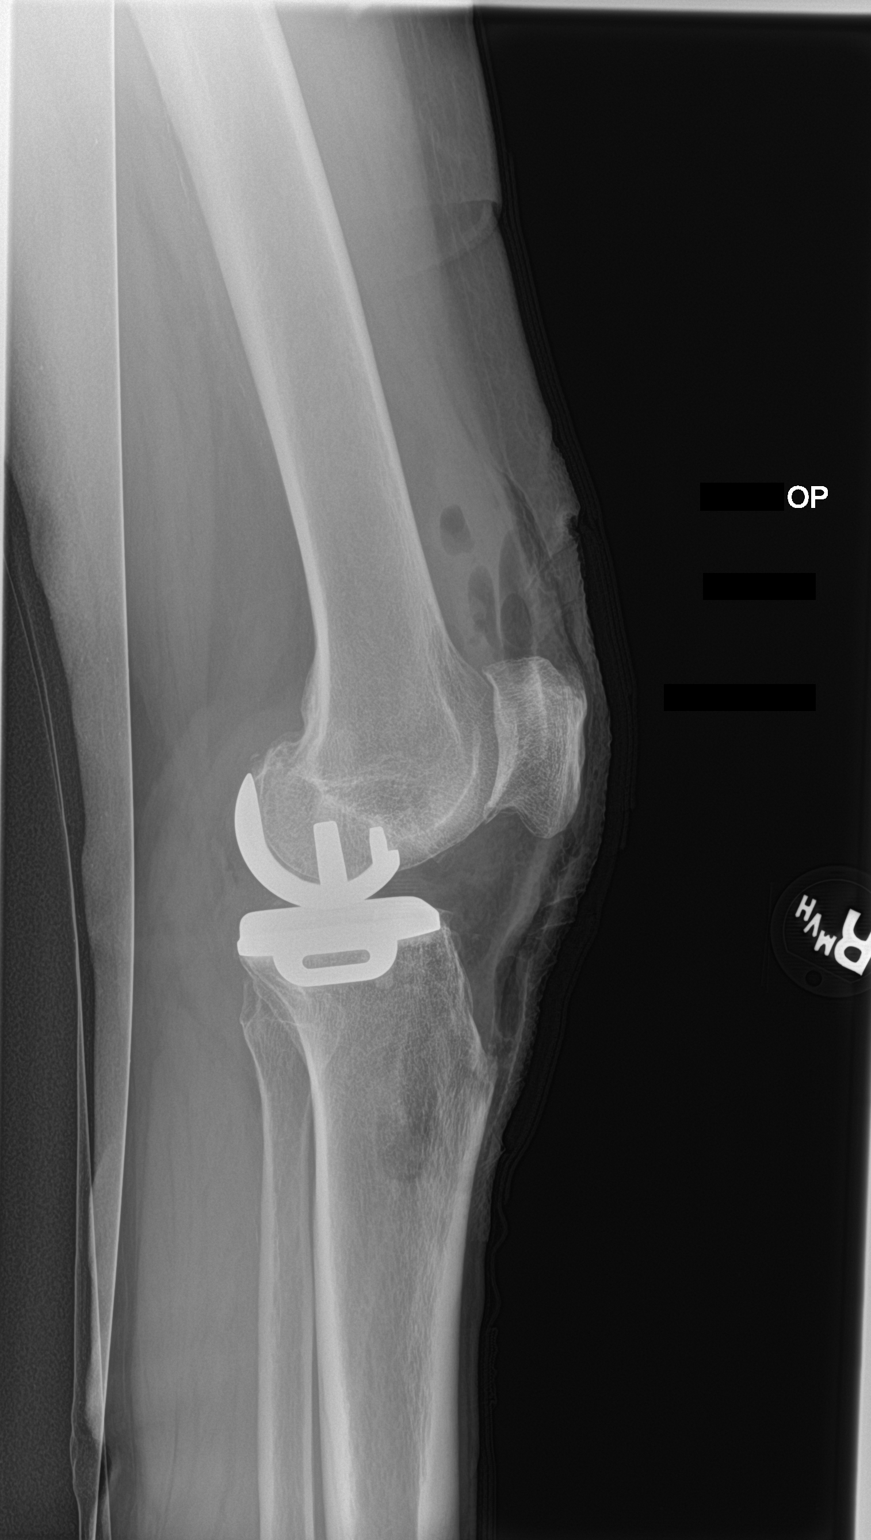

[2 of 2 positions shown; findings below may reference images not displayed]

FINDINGS: Postoperative views of the right knee done portably show
unicompartmental right knee replacement involving the medial
compartment. There is only mild degenerative change within the
lateral and patellofemoral compartments. Some air is noted in the
soft tissues and joint space postoperatively. No complicating
features are seen.
IMPRESSION: Unicompartmental knee replacement bulging the medial compartment. No
complicating features.

## 2020-09-26 ENCOUNTER — Encounter: Payer: Self-pay | Admitting: Nurse Practitioner

## 2020-09-26 ENCOUNTER — Ambulatory Visit (INDEPENDENT_AMBULATORY_CARE_PROVIDER_SITE_OTHER): Payer: Medicare HMO | Admitting: Nurse Practitioner

## 2020-09-26 ENCOUNTER — Other Ambulatory Visit: Payer: Self-pay

## 2020-09-26 VITALS — BP 110/80 | HR 68 | Temp 98.6°F | Ht 60.4 in | Wt 144.8 lb

## 2020-09-26 DIAGNOSIS — R059 Cough, unspecified: Secondary | ICD-10-CM | POA: Diagnosis not present

## 2020-09-26 DIAGNOSIS — I10 Essential (primary) hypertension: Secondary | ICD-10-CM

## 2020-09-26 DIAGNOSIS — E785 Hyperlipidemia, unspecified: Secondary | ICD-10-CM

## 2020-09-26 MED ORDER — BENZONATATE 100 MG PO CAPS: 100.0000 mg | ORAL_CAPSULE | Freq: Four times a day (QID) | ORAL | 1 refills | Status: AC | PRN

## 2020-09-26 MED ORDER — CETIRIZINE HCL 10 MG PO TABS
10.0000 mg | ORAL_TABLET | Freq: Every day | ORAL | 2 refills | Status: DC
Start: 2020-09-26 — End: 2022-09-29

## 2020-09-26 NOTE — Patient Instructions (Signed)

## 2020-09-26 NOTE — Progress Notes (Signed)
I,Yamilka Roman [employer] as a Education administrator for [employer], FNP.,have documented all relevant documentation on the behalf of Minette Brine, FNP,as directed by  Minette Brine, FNP while in the presence of Minette Brine, [place]. This visit occurred during the SARS-CoV-2 public health emergency.  Safety protocols were in place, including screening questions prior to the visit, additional usage of staff PPE, and extensive cleaning of exam room while observing appropriate contact time as indicated for disinfecting solutions.  Subjective:     Patient ID: Tara Collins , female    DOB: 21-Apr-1935 , 86 y.o.   MRN: [id number]   Chief Complaint  Patient presents with  . Hypertension  . Cough    Patient stated she has had a cough that started last week. She stated it is a productive cough. She stated she had a fever last week but has not had one since.     HPI  Patient presents today for a blood pressure f/u. She also reports having a cough since last week. She has been taking robitussin but has not been effective.   Hypertension This is a chronic problem. The current episode started more than 1 year ago. The problem is unchanged. The problem is controlled. Pertinent negatives include no anxiety, chest pain, headaches, malaise/fatigue, palpitations or shortness of breath. There are no associated agents to hypertension. Risk factors for coronary artery disease include sedentary lifestyle. There are no compliance problems.      Past Medical History:  Diagnosis Date  . Arthritis   . Hepatitis B 03/1963  . Hypertension   . PONV (postoperative nausea and vomiting)   . Primary localized osteoarthritis of right knee 07/05/2018  . Seasonal allergies      History reviewed. No pertinent family history.   Current Outpatient Medications:  .  benzonatate (TESSALON PERLES) 100 MG capsule, Take 1 capsule (100 mg total) by mouth every 6 (six) hours as needed for cough., Disp: 30 capsule, Rfl: 1 .   carboxymethylcellulose (REFRESH PLUS) 0.5 % SOLN, Place 1 drop into both eyes daily as needed (dry eyes)., Disp: , Rfl:  .  cetirizine (ZYRTEC ALLERGY) 10 MG tablet, Take 1 tablet (10 mg total) by mouth daily., Disp: 30 tablet, Rfl: 2 .  traZODone (DESYREL) 50 MG tablet, TAKE 1 TABLET(50 MG) BY MOUTH AT BEDTIME AS NEEDED FOR SLEEP, Disp: 30 tablet, Rfl: 3 .  amLODipine (NORVASC) 10 MG tablet, TAKE 1 TABLET EVERY DAY, Disp: 90 tablet, Rfl: 1 .  diclofenac Sodium (VOLTAREN) 1 % GEL, Apply 2 g topically 4 (four) times daily. (Patient not taking: Reported on 09/26/2020), Disp: 100 g, Rfl: 1 .  losartan (COZAAR) 100 MG tablet, TAKE 1 TABLET(100 MG) BY MOUTH DAILY, Disp: 90 tablet, Rfl: 1 .  lovastatin (MEVACOR) 40 MG tablet, TAKE 1 TABLET(40 MG) BY MOUTH DAILY, Disp: 90 tablet, Rfl: 1 .  potassium chloride (MICRO-K) 10 MEQ CR capsule, TAKE 1 CAPSULE EVERY DAY, Disp: 90 capsule, Rfl: 0   Allergies  Allergen Reactions  . Contrast Media [Iodinated Diagnostic Agents] Shortness Of Breath    ALLERGY ADDED FROM QUESTIONING PT  SHE HAD A PREVIOUS REACTION TO CONTRAST   . Latex Rash    "skin burns"     Review of Systems  Constitutional: Negative.  Negative for malaise/fatigue.  HENT: Negative.   Eyes: Negative.   Respiratory: Positive for cough (productive and is worse at night). Negative for shortness of breath.   Cardiovascular: Negative.  Negative for chest pain, palpitations and leg swelling.  Gastrointestinal: Negative.   Endocrine: Negative.   Genitourinary: Negative.   Musculoskeletal: Negative.   Skin: Negative.   Neurological: Negative.  Negative for headaches.  Hematological: Negative.   Psychiatric/Behavioral: Negative.      Today's Vitals   09/26/20 0928  BP: 110/80  Pulse: 68  Temp: 98.6 F (37 C)  TempSrc: Oral  Weight: 144 lb 12.8 oz (65.7 kg)  Height: 5' 0.4" (1.534 m)  PainSc: 0-No pain   Body mass index is 27.91 kg/m.   Objective:  Physical Exam Constitutional:       General: She is not in acute distress.    Appearance: Normal appearance. She is normal weight.  Eyes:     Pupils: Pupils are equal, round, and reactive to light.  Cardiovascular:     Rate and Rhythm: Normal rate and regular rhythm.     Pulses: Normal pulses.     Heart sounds: Normal heart sounds. No murmur heard.   Pulmonary:     Effort: Pulmonary effort is normal. No respiratory distress.     Breath sounds: Normal breath sounds.  Musculoskeletal:     Right lower leg: No edema.     Left lower leg: No edema.  Skin:    General: Skin is warm and dry.     Capillary Refill: Capillary refill takes less than 2 seconds.  Neurological:     General: No focal deficit present.     Mental Status: She is alert and oriented to person, place, and time.     Cranial Nerves: No cranial nerve deficit.  Psychiatric:        Mood and Affect: Mood normal.        Behavior: Behavior normal.        Thought Content: Thought content normal.        Judgment: Judgment normal.         Assessment And Plan:     1. Essential hypertension  Chronic, well controlled  Continue with current medications - CMP14+EGFR  2. Cough  She has been having a hacking cough, will check for covid and treat with tessalon perles and antihistamine - Novel Coronavirus, NAA ([facility]) - benzonatate (TESSALON PERLES) 100 MG capsule; Take 1 capsule (100 mg total) by mouth every 6 (six) hours as needed for cough.  Dispense: 30 capsule; Refill: 1 - cetirizine (ZYRTEC ALLERGY) 10 MG tablet; Take 1 tablet (10 mg total) by mouth daily.  Dispense: 30 tablet; Refill: 2  3. Dyslipidemia  Chronic  Continue with current medications, tolerating well - Lipid panel - CMP14+EGFR     Patient was given opportunity to ask questions. Patient verbalized understanding of the plan and was able to repeat key elements of the plan. All questions were answered to their satisfaction.  Minette Brine, FNP   I, Minette Brine, FNP, have reviewed  all documentation for this visit. The documentation o 03//22 for the exam, diagnosis, procedures, and orders are all accurate and complete  THE PATIENT IS ENCOURAGED TO PRACTICE SOCIAL DISTANCING DUE TO THE COVID-19 PANDEMIC.

## 2020-09-27 LAB — SARS-COV-2, NAA 2 DAY TAT

## 2020-09-27 LAB — LIPID PANEL
Chol/HDL Ratio: 3.6 ratio (ref 0.0–4.4)
Cholesterol, Total: 151 mg/dL (ref 100–199)
HDL: 42 mg/dL (ref >39)
LDL Chol Calc (NIH): 83 mg/dL (ref 0–99)
Triglycerides: 149 mg/dL (ref 0–149)
VLDL Cholesterol Cal: 26 mg/dL (ref 5–40)

## 2020-09-27 LAB — CMP14+EGFR
ALT: 9 IU/L (ref 0–32)
AST: 15 IU/L (ref 0–40)
Albumin/Globulin Ratio: 1.4 (ref 1.2–2.2)
Albumin: 4.1 g/dL (ref 3.6–4.6)
Alkaline Phosphatase: 100 IU/L (ref 44–121)
BUN/Creatinine Ratio: 12 (ref 12–28)
BUN: 10 mg/dL (ref 8–27)
Bilirubin Total: 0.5 mg/dL (ref 0.0–1.2)
CO2: 23 mmol/L (ref 20–29)
Calcium: 9.4 mg/dL (ref 8.7–10.3)
Chloride: 104 mmol/L (ref 96–106)
Creatinine, Ser: 0.82 mg/dL (ref 0.57–1.00)
Globulin, Total: 2.9 g/dL (ref 1.5–4.5)
Glucose: 88 mg/dL (ref 65–99)
Potassium: 3.7 mmol/L (ref 3.5–5.2)
Sodium: 141 mmol/L (ref 134–144)
Total Protein: 7 g/dL (ref 6.0–8.5)
eGFR: 71 mL/min/{1.73_m2} (ref >59)

## 2020-09-27 LAB — NOVEL CORONAVIRUS, NAA: SARS-CoV-2, NAA: NOT DETECTED

## 2020-09-30 ENCOUNTER — Telehealth: Payer: Self-pay

## 2020-09-30 NOTE — Telephone Encounter (Signed)
-----   Message from [name], FNP sent at 09/29/2020  8:10 AM EST ----- All of your labs are normal and you are negative for covid.

## 2020-09-30 NOTE — Telephone Encounter (Signed)
Patient notified to buy benzonatate cash price

## 2020-09-30 NOTE — Telephone Encounter (Signed)
Left the patient a message to call back for lab results. 

## 2020-10-11 ENCOUNTER — Other Ambulatory Visit: Payer: Self-pay | Admitting: Nurse Practitioner

## 2020-10-11 DIAGNOSIS — E876 Hypokalemia: Secondary | ICD-10-CM

## 2020-10-12 ENCOUNTER — Other Ambulatory Visit: Payer: Self-pay | Admitting: Nurse Practitioner

## 2020-10-12 DIAGNOSIS — I10 Essential (primary) hypertension: Secondary | ICD-10-CM

## 2020-10-12 DIAGNOSIS — E785 Hyperlipidemia, unspecified: Secondary | ICD-10-CM

## 2020-12-26 ENCOUNTER — Other Ambulatory Visit: Payer: Self-pay | Admitting: Nurse Practitioner

## 2020-12-26 DIAGNOSIS — E876 Hypokalemia: Secondary | ICD-10-CM

## 2021-03-06 ENCOUNTER — Other Ambulatory Visit: Payer: Self-pay | Admitting: Nurse Practitioner

## 2021-03-06 DIAGNOSIS — I10 Essential (primary) hypertension: Secondary | ICD-10-CM

## 2021-03-06 DIAGNOSIS — E785 Hyperlipidemia, unspecified: Secondary | ICD-10-CM

## 2021-03-10 ENCOUNTER — Other Ambulatory Visit: Payer: Self-pay | Admitting: Nurse Practitioner

## 2021-03-10 DIAGNOSIS — E785 Hyperlipidemia, unspecified: Secondary | ICD-10-CM

## 2021-03-10 DIAGNOSIS — I10 Essential (primary) hypertension: Secondary | ICD-10-CM

## 2021-03-27 ENCOUNTER — Ambulatory Visit (INDEPENDENT_AMBULATORY_CARE_PROVIDER_SITE_OTHER): Payer: Medicare HMO | Admitting: Nurse Practitioner

## 2021-03-27 ENCOUNTER — Other Ambulatory Visit: Payer: Self-pay

## 2021-03-27 ENCOUNTER — Encounter: Payer: Self-pay | Admitting: Nurse Practitioner

## 2021-03-27 VITALS — BP 102/70 | HR 72 | Temp 98.1°F | Ht 60.0 in | Wt 145.2 lb

## 2021-03-27 DIAGNOSIS — I10 Essential (primary) hypertension: Secondary | ICD-10-CM

## 2021-03-27 DIAGNOSIS — M2559 Pain in other specified joint: Secondary | ICD-10-CM | POA: Diagnosis not present

## 2021-03-27 DIAGNOSIS — Z8261 Family history of arthritis: Secondary | ICD-10-CM

## 2021-03-27 DIAGNOSIS — Z0000 Encounter for general adult medical examination without abnormal findings: Secondary | ICD-10-CM | POA: Diagnosis not present

## 2021-03-27 DIAGNOSIS — R198 Other specified symptoms and signs involving the digestive system and abdomen: Secondary | ICD-10-CM | POA: Diagnosis not present

## 2021-03-27 LAB — POCT URINALYSIS DIPSTICK
Bilirubin, UA: NEGATIVE
Blood, UA: NEGATIVE
Glucose, UA: NEGATIVE
Ketones, UA: NEGATIVE
Nitrite, UA: NEGATIVE
Protein, UA: POSITIVE — AB
Spec Grav, UA: 1.02 (ref 1.010–1.025)
Urobilinogen, UA: 0.2 E.U./dL
pH, UA: 6 (ref 5.0–8.0)

## 2021-03-27 NOTE — Progress Notes (Signed)
This visit occurred during the SARS-CoV-2 public health emergency.  Safety protocols were in place, including screening questions prior to the visit, additional usage of staff PPE, and extensive cleaning of exam room while observing appropriate contact time as indicated for disinfecting solutions.  Subjective:     Patient ID: Tara Collins , female    DOB: 10/23/1934 , 86 y.o.   MRN: [id number]   Chief Complaint  Patient presents with   Annual Exam    HPI  Here for HM.  Wt Readings from Last 3 Encounters: 03/27/21 : 145 lb 3.2 oz (65.9 kg) 09/26/20 : 144 lb 12.8 oz (65.7 kg) 03/21/20 : 149 lb (67.6 kg)      Past Medical History:  Diagnosis Date   Arthritis    Hepatitis B 03/1963   Hypertension    PONV (postoperative nausea and vomiting)    Primary localized osteoarthritis of right knee 07/05/2018   Seasonal allergies      History reviewed. No pertinent family history.   Current Outpatient Medications:    amLODipine (NORVASC) 10 MG tablet, TAKE 1 TABLET EVERY DAY, Disp: 90 tablet, Rfl: 1   benzonatate (TESSALON PERLES) 100 MG capsule, Take 1 capsule (100 mg total) by mouth every 6 (six) hours as needed for cough., Disp: 30 capsule, Rfl: 1   carboxymethylcellulose (REFRESH PLUS) 0.5 % SOLN, Place 1 drop into both eyes daily as needed (dry eyes)., Disp: , Rfl:    cetirizine (ZYRTEC ALLERGY) 10 MG tablet, Take 1 tablet (10 mg total) by mouth daily., Disp: 30 tablet, Rfl: 2   diclofenac Sodium (VOLTAREN) 1 % GEL, Apply 2 g topically 4 (four) times daily., Disp: 100 g, Rfl: 1   losartan (COZAAR) 100 MG tablet, TAKE 1 TABLET EVERY DAY, Disp: 90 tablet, Rfl: 1   lovastatin (MEVACOR) 40 MG tablet, TAKE 1 TABLET EVERY DAY, Disp: 90 tablet, Rfl: 1   potassium chloride (MICRO-K) 10 MEQ CR capsule, TAKE 1 CAPSULE EVERY DAY, Disp: 90 capsule, Rfl: 0   traZODone (DESYREL) 50 MG tablet, TAKE 1 TABLET(50 MG) BY MOUTH AT BEDTIME AS NEEDED FOR SLEEP, Disp: 30 tablet, Rfl: 3    Allergies  Allergen Reactions   Contrast Media [Iodinated Diagnostic Agents] Shortness Of Breath    ALLERGY ADDED FROM QUESTIONING PT  SHE HAD A PREVIOUS REACTION TO CONTRAST    Latex Rash    "skin burns"      The patient states she is status post hysterectomy.  No LMP recorded. Patient has had a hysterectomy..  Negative for: breast discharge, breast lump(s), breast pain and breast self exam. Associated symptoms include abnormal vaginal bleeding. Pertinent negatives include abnormal bleeding (hematology), anxiety, decreased libido, depression, difficulty falling sleep, dyspareunia, history of infertility, nocturia, sexual dysfunction, sleep disturbances, urinary incontinence, urinary urgency, vaginal discharge and vaginal itching. Diet regular.The patient states her exercise level is minimal.    The patient's tobacco use is:  Social History   Tobacco Use  Smoking Status Never  Smokeless Tobacco Never   She has been exposed to passive smoke. The patient's alcohol use is:  Social History   Substance and Sexual Activity  Alcohol Use Not Currently     Review of Systems  Constitutional: Negative.   HENT: Negative.    Eyes: Negative.   Respiratory: Negative.    Cardiovascular: Negative.   Gastrointestinal: Negative.   Endocrine: Negative.   Genitourinary: Negative.   Musculoskeletal:  Positive for arthralgias.  Skin: Negative.   Allergic/Immunologic: Negative.  Neurological: Negative.   Hematological: Negative.   Psychiatric/Behavioral: Negative.      Today's Vitals   03/27/21 0949  BP: 102/70  Pulse: 72  Temp: 98.1 F (36.7 C)  SpO2: 99%  Weight: 145 lb 3.2 oz (65.9 kg)  Height: 5' (1.524 m)  PainSc: 0-No pain   Body mass index is 28.36 kg/m.   Objective:  Physical Exam Constitutional:      General: She is not in acute distress.    Appearance: Normal appearance. She is well-developed. She is obese.  HENT:     Head: Normocephalic and atraumatic.     Right  Ear: Hearing, tympanic membrane, ear canal and external ear normal. There is no impacted cerumen.     Left Ear: Hearing, tympanic membrane, ear canal and external ear normal. There is no impacted cerumen.     Nose:     Comments: Deferred - masked    Mouth/Throat:     Comments: Deferred - masked Eyes:     General: Lids are normal.     Extraocular Movements: Extraocular movements intact.     Conjunctiva/sclera: Conjunctivae normal.     Pupils: Pupils are equal, round, and reactive to light.     Funduscopic exam:    Right eye: No papilledema.        Left eye: No papilledema.  Neck:     Thyroid: No thyroid mass.     Vascular: No carotid bruit.  Cardiovascular:     Rate and Rhythm: Normal rate and regular rhythm.     Pulses: Normal pulses.     Heart sounds: Normal heart sounds. No murmur heard. Pulmonary:     Effort: Pulmonary effort is normal.     Breath sounds: Normal breath sounds.  Chest:     Chest wall: No mass.  Breasts:    Tanner Score is 5.     Right: Normal. No mass or tenderness.     Left: Normal. No mass or tenderness.  Abdominal:     General: Abdomen is flat. Bowel sounds are normal. There is no distension.     Palpations: Abdomen is soft.     Tenderness: There is no abdominal tenderness.  Genitourinary:    Rectum: Guaiac result negative.  Musculoskeletal:        General: No swelling or tenderness (bilateral knees). Normal range of motion.     Cervical back: Full passive range of motion without pain, normal range of motion and neck supple.     Right lower leg: No edema.     Left lower leg: No edema.  Lymphadenopathy:     Upper Body:     Right upper body: No supraclavicular, axillary or pectoral adenopathy.     Left upper body: No supraclavicular, axillary or pectoral adenopathy.  Skin:    General: Skin is warm and dry.     Capillary Refill: Capillary refill takes less than 2 seconds.  Neurological:     General: No focal deficit present.     Mental Status: She  is alert and oriented to person, place, and time.     Cranial Nerves: No cranial nerve deficit.     Sensory: No sensory deficit.  Psychiatric:        Mood and Affect: Mood normal.        Behavior: Behavior normal.        Thought Content: Thought content normal.        Judgment: Judgment normal.        Assessment  And Plan:     1. Encounter for annual general medical examination without abnormal findings in adult - CMP14+EGFR - CBC - Hemoglobin A1c - Lipid panel - POCT Urinalysis Dipstick (81002)  2. Essential hypertension Comments: Well controlled, continue current medications - EKG 12-Lead - Microalbumin / Creatinine Urine Ratio  3. Pain in other joint Comments: Having worsening pain to her joints, will check autoimmune panel.  Encouraged to use over the counter pain cream - Autoimmune Profile - CRP (C-Reactive Protein)  4. Alternating constipation and diarrhea Comments: Given samples of probiotic Restora, can get the generic  5. Family history of rheumatoid arthritis - Autoimmune Profile - CRP (C-Reactive Protein)   Patient was given opportunity to ask questions. Patient verbalized understanding of the plan and was able to repeat key elements of the plan. All questions were answered to their satisfaction.   Minette Brine, FNP   I, Minette Brine, FNP, have reviewed all documentation for this visit. The documentation on 03/27/21 for the exam, diagnosis, procedures, and orders are all accurate and complete.   THE PATIENT IS ENCOURAGED TO PRACTICE SOCIAL DISTANCING DUE TO THE COVID-19 PANDEMIC.

## 2021-03-27 NOTE — Patient Instructions (Addendum)
Health Maintenance, Female Adopting a healthy lifestyle and getting preventive care are important in promoting health and wellness. Ask your health care provider about: The right schedule for you to have regular tests and exams. Things you can do on your own to prevent diseases and keep yourself healthy. What should I know about diet, weight, and exercise? Eat a healthy diet  Eat a diet that includes plenty of vegetables, fruits, low-fat dairy products, and lean protein. Do not eat a lot of foods that are high in solid fats, added sugars, or sodium. Maintain a healthy weight Body mass index (BMI) is used to identify weight problems. It estimates body fat based on height and weight. Your health care provider can help determine your BMI and help you achieve or maintain a healthy weight. Get regular exercise Get regular exercise. This is one of the most important things you can do for your health. Most adults should: Exercise for at least 150 minutes each week. The exercise should increase your heart rate and make you sweat (moderate-intensity exercise). Do strengthening exercises at least twice a week. This is in addition to the moderate-intensity exercise. Spend less time sitting. Even light physical activity can be beneficial. Watch cholesterol and blood lipids Have your blood tested for lipids and cholesterol at 84 years of age, then have this test every 5 years. Have your cholesterol levels checked more often if: Your lipid or cholesterol levels are high. You are older than 84 years of age. You are at high risk for heart disease. What should I know about cancer screening? Depending on your health history and family history, you may need to have cancer screening at various ages. This may include screening for: Breast cancer. Cervical cancer. Colorectal cancer. Skin cancer. Lung cancer. What should I know about heart disease, diabetes, and high blood pressure? Blood pressure and heart  disease High blood pressure causes heart disease and increases the risk of stroke. This is more likely to develop in people who have high blood pressure readings, are of African descent, or are overweight. Have your blood pressure checked: Every 3-5 years if you are 18-39 years of age. Every year if you are 40 years old or older. Diabetes Have regular diabetes screenings. This checks your fasting blood sugar level. Have the screening done: Once every three years after age 40 if you are at a normal weight and have a low risk for diabetes. More often and at a younger age if you are overweight or have a high risk for diabetes. What should I know about preventing infection? Hepatitis B If you have a higher risk for hepatitis B, you should be screened for this virus. Talk with your health care provider to find out if you are at risk for hepatitis B infection. Hepatitis C Testing is recommended for: Everyone born from 1945 through 1965. Anyone with known risk factors for hepatitis C. Sexually transmitted infections (STIs) Get screened for STIs, including gonorrhea and chlamydia, if: You are sexually active and are younger than 84 years of age. You are older than 84 years of age and your health care provider tells you that you are at risk for this type of infection. Your sexual activity has changed since you were last screened, and you are at increased risk for chlamydia or gonorrhea. Ask your health care provider if you are at risk. Ask your health care provider about whether you are at high risk for HIV. Your health care provider may recommend a prescription medicine   to help prevent HIV infection. If you choose to take medicine to prevent HIV, you should first get tested for HIV. You should then be tested every 3 months for as long as you are taking the medicine. Pregnancy If you are about to stop having your period (premenopausal) and you may become pregnant, seek counseling before you get  pregnant. Take 400 to 800 micrograms (mcg) of folic acid every day if you become pregnant. Ask for birth control (contraception) if you want to prevent pregnancy. Osteoporosis and menopause Osteoporosis is a disease in which the bones lose minerals and strength with aging. This can result in bone fractures. If you are 52 years old or older, or if you are at risk for osteoporosis and fractures, ask your health care provider if you should: Be screened for bone loss. Take a calcium or vitamin D supplement to lower your risk of fractures. Be given hormone replacement therapy (HRT) to treat symptoms of menopause. Follow these instructions at home: Lifestyle Do not use any products that contain nicotine or tobacco, such as cigarettes, e-cigarettes, and chewing tobacco. If you need help quitting, ask your health care provider. Do not use street drugs. Do not share needles. Ask your health care provider for help if you need support or information about quitting drugs. Alcohol use Do not drink alcohol if: Your health care provider tells you not to drink. You are pregnant, may be pregnant, or are planning to become pregnant. If you drink alcohol: Limit how much you use to 0-1 drink a day. Limit intake if you are breastfeeding. Be aware of how much alcohol is in your drink. In the [place] one drink equals one 12 oz bottle of beer (355 mL), one 5 oz glass of wine (148 mL), or one 1 oz glass of hard liquor (44 mL). General instructions Schedule regular health, dental, and eye exams. Stay current with your vaccines. Tell your health care provider if: You often feel depressed. You have ever been abused or do not feel safe at home. Summary Adopting a healthy lifestyle and getting preventive care are important in promoting health and wellness. Follow your health care provider's instructions about healthy diet, exercising, and getting tested or screened for diseases. Follow your health care provider's  instructions on monitoring your cholesterol and blood pressure. This information is not intended to replace advice given to you by your health care provider. Make sure you discuss any questions you have with your health care provider. Document Revised: 09/20/2020 Document Reviewed: 07/06/2018 Elsevier Patient Education  2022 [employer]   Take an over the counter probiotic Take over the counter Tumeric for joint pain

## 2021-03-28 LAB — CMP14+EGFR
ALT: 22 IU/L (ref 0–32)
AST: 27 IU/L (ref 0–40)
Albumin/Globulin Ratio: 1.7 (ref 1.2–2.2)
Albumin: 4.4 g/dL (ref 3.6–4.6)
Alkaline Phosphatase: 80 IU/L (ref 44–121)
BUN/Creatinine Ratio: 12 (ref 12–28)
BUN: 13 mg/dL (ref 8–27)
Bilirubin Total: 0.5 mg/dL (ref 0.0–1.2)
CO2: 21 mmol/L (ref 20–29)
Calcium: 9.6 mg/dL (ref 8.7–10.3)
Chloride: 108 mmol/L — ABNORMAL HIGH (ref 96–106)
Creatinine, Ser: 1.05 mg/dL — ABNORMAL HIGH (ref 0.57–1.00)
Globulin, Total: 2.6 g/dL (ref 1.5–4.5)
Glucose: 97 mg/dL (ref 65–99)
Potassium: 4 mmol/L (ref 3.5–5.2)
Sodium: 143 mmol/L (ref 134–144)
Total Protein: 7 g/dL (ref 6.0–8.5)
eGFR: 52 mL/min/{1.73_m2} — ABNORMAL LOW (ref >59)

## 2021-03-28 LAB — CBC
Hematocrit: 39.9 % (ref 34.0–46.6)
Hemoglobin: 13.7 g/dL (ref 11.1–15.9)
MCH: 33 pg (ref 26.6–33.0)
MCHC: 34.3 g/dL (ref 31.5–35.7)
MCV: 96 fL (ref 79–97)
Platelets: 289 10*3/uL (ref 150–450)
RBC: 4.15 x10E6/uL (ref 3.77–5.28)
RDW: 11.5 % — ABNORMAL LOW (ref 11.7–15.4)
WBC: 5.9 10*3/uL (ref 3.4–10.8)

## 2021-03-28 LAB — C-REACTIVE PROTEIN: CRP: <1 mg/L (ref 0–10)

## 2021-03-28 LAB — MICROALBUMIN / CREATININE URINE RATIO
Creatinine, Urine: 200.5 mg/dL
Microalb/Creat Ratio: 39 mg/g creat — ABNORMAL HIGH (ref 0–29)
Microalbumin, Urine: 78 ug/mL

## 2021-03-28 LAB — LIPID PANEL
Chol/HDL Ratio: 2.4 ratio (ref 0.0–4.4)
Cholesterol, Total: 120 mg/dL (ref 100–199)
HDL: 51 mg/dL (ref >39)
LDL Chol Calc (NIH): 46 mg/dL (ref 0–99)
Triglycerides: 131 mg/dL (ref 0–149)
VLDL Cholesterol Cal: 23 mg/dL (ref 5–40)

## 2021-03-28 LAB — AUTOIMMUNE PROFILE
Anti Nuclear Antibody (ANA): NEGATIVE
Complement C3, Serum: 126 mg/dL (ref 82–167)
dsDNA Ab: <1 IU/mL (ref 0–9)

## 2021-03-28 LAB — HEMOGLOBIN A1C
Est. average glucose Bld gHb Est-mCnc: 108 mg/dL
Hgb A1c MFr Bld: 5.4 % (ref 4.8–5.6)

## 2021-04-03 ENCOUNTER — Ambulatory Visit (INDEPENDENT_AMBULATORY_CARE_PROVIDER_SITE_OTHER): Payer: Medicare HMO

## 2021-04-03 VITALS — Ht 60.0 in | Wt 145.0 lb

## 2021-04-03 DIAGNOSIS — Z0000 Encounter for general adult medical examination without abnormal findings: Secondary | ICD-10-CM | POA: Diagnosis not present

## 2021-04-03 NOTE — Patient Instructions (Signed)
[name] , Thank you for taking time to come for your Medicare Wellness Visit. I appreciate your ongoing commitment to your health goals. Please review the following plan we discussed and let me know if I can assist you in the future.   Screening recommendations/referrals: Colonoscopy: not required Mammogram: not required Bone Density: completed 12/07/08 Recommended yearly ophthalmology/optometry visit for glaucoma screening and checkup Recommended yearly dental visit for hygiene and checkup  Vaccinations: Influenza vaccine: decline Pneumococcal vaccine: decline Tdap vaccine: decline Shingles vaccine: discussed   Covid-19: 06/15/2020, 10/04/2019, 09/09/2019  Advanced directives: will mail a copy  Conditions/risks identified: none  Next appointment: Follow up in one year for your annual wellness visit    Preventive Care 65 Years and Older, Female Preventive care refers to lifestyle choices and visits with your health care provider that can promote health and wellness. What does preventive care include? A yearly physical exam. This is also called an annual well check. Dental exams once or twice a year. Routine eye exams. Ask your health care provider how often you should have your eyes checked. Personal lifestyle choices, including: Daily care of your teeth and gums. Regular physical activity. Eating a healthy diet. Avoiding tobacco and drug use. Limiting alcohol use. Practicing safe sex. Taking low-dose aspirin every day. Taking vitamin and mineral supplements as recommended by your health care provider. What happens during an annual well check? The services and screenings done by your health care provider during your annual well check will depend on your age, overall health, lifestyle risk factors, and family history of disease. Counseling  Your health care provider may ask you questions about your: Alcohol use. Tobacco use. Drug use. Emotional well-being. Home and  relationship well-being. Sexual activity. Eating habits. History of falls. Memory and ability to understand (cognition). Work and work Astronomer. Reproductive health. Screening  You may have the following tests or measurements: Height, weight, and BMI. Blood pressure. Lipid and cholesterol levels. These may be checked every 5 years, or more frequently if you are over 14 years old. Skin check. Lung cancer screening. You may have this screening every year starting at age 47 if you have a 30-pack-year history of smoking and currently smoke or have quit within the past 15 years. Fecal occult blood test (FOBT) of the stool. You may have this test every year starting at age 43. Flexible sigmoidoscopy or colonoscopy. You may have a sigmoidoscopy every 5 years or a colonoscopy every 10 years starting at age 66. Hepatitis C blood test. Hepatitis B blood test. Sexually transmitted disease (STD) testing. Diabetes screening. This is done by checking your blood sugar (glucose) after you have not eaten for a while (fasting). You may have this done every 1-3 years. Bone density scan. This is done to screen for osteoporosis. You may have this done starting at age 53. Mammogram. This may be done every 1-2 years. Talk to your health care provider about how often you should have regular mammograms. Talk with your health care provider about your test results, treatment options, and if necessary, the need for more tests. Vaccines  Your health care provider may recommend certain vaccines, such as: Influenza vaccine. This is recommended every year. Tetanus, diphtheria, and acellular pertussis (Tdap, Td) vaccine. You may need a Td booster every 10 years. Zoster vaccine. You may need this after age 23. Pneumococcal 13-valent conjugate (PCV13) vaccine. One dose is recommended after age 61. Pneumococcal polysaccharide (PPSV23) vaccine. One dose is recommended after age 13. Talk to your  health care provider  about which screenings and vaccines you need and how often you need them. This information is not intended to replace advice given to you by your health care provider. Make sure you discuss any questions you have with your health care provider. Document Released: 08/09/2015 Document Revised: 04/01/2016 Document Reviewed: 05/14/2015 Elsevier Interactive Patient Education  2017 [place] Prevention in the Home Falls can cause injuries. They can happen to people of all ages. There are many things you can do to make your home safe and to help prevent falls. What can I do on the outside of my home? Regularly fix the edges of walkways and driveways and fix any cracks. Remove anything that might make you trip as you walk through a door, such as a raised step or threshold. Trim any bushes or trees on the path to your home. Use bright outdoor lighting. Clear any walking paths of anything that might make someone trip, such as rocks or tools. Regularly check to see if handrails are loose or broken. Make sure that both sides of any steps have handrails. Any raised decks and porches should have guardrails on the edges. Have any leaves, snow, or ice cleared regularly. Use sand or salt on walking paths during winter. Clean up any spills in your garage right away. This includes oil or grease spills. What can I do in the bathroom? Use night lights. Install grab bars by the toilet and in the tub and shower. Do not use towel bars as grab bars. Use non-skid mats or decals in the tub or shower. If you need to sit down in the shower, use a plastic, non-slip stool. Keep the floor dry. Clean up any water that spills on the floor as soon as it happens. Remove soap buildup in the tub or shower regularly. Attach bath mats securely with double-sided non-slip rug tape. Do not have throw rugs and other things on the floor that can make you trip. What can I do in the bedroom? Use night lights. Make sure  that you have a light by your bed that is easy to reach. Do not use any sheets or blankets that are too big for your bed. They should not hang down onto the floor. Have a firm chair that has side arms. You can use this for support while you get dressed. Do not have throw rugs and other things on the floor that can make you trip. What can I do in the kitchen? Clean up any spills right away. Avoid walking on wet floors. Keep items that you use a lot in easy-to-reach places. If you need to reach something above you, use a strong step stool that has a grab bar. Keep electrical cords out of the way. Do not use floor polish or wax that makes floors slippery. If you must use wax, use non-skid floor wax. Do not have throw rugs and other things on the floor that can make you trip. What can I do with my stairs? Do not leave any items on the stairs. Make sure that there are handrails on both sides of the stairs and use them. Fix handrails that are broken or loose. Make sure that handrails are as long as the stairways. Check any carpeting to make sure that it is firmly attached to the stairs. Fix any carpet that is loose or worn. Avoid having throw rugs at the top or bottom of the stairs. If you do have throw rugs, attach them  to the floor with carpet tape. Make sure that you have a light switch at the top of the stairs and the bottom of the stairs. If you do not have them, ask someone to add them for you. What else can I do to help prevent falls? Wear shoes that: Do not have high heels. Have rubber bottoms. Are comfortable and fit you well. Are closed at the toe. Do not wear sandals. If you use a stepladder: Make sure that it is fully opened. Do not climb a closed stepladder. Make sure that both sides of the stepladder are locked into place. Ask someone to hold it for you, if possible. Clearly mark and make sure that you can see: Any grab bars or handrails. First and last steps. Where the edge of  each step is. Use tools that help you move around (mobility aids) if they are needed. These include: Canes. Walkers. Scooters. Crutches. Turn on the lights when you go into a dark area. Replace any light bulbs as soon as they burn out. Set up your furniture so you have a clear path. Avoid moving your furniture around. If any of your floors are uneven, fix them. If there are any pets around you, be aware of where they are. Review your medicines with your doctor. Some medicines can make you feel dizzy. This can increase your chance of falling. Ask your doctor what other things that you can do to help prevent falls. This information is not intended to replace advice given to you by your health care provider. Make sure you discuss any questions you have with your health care provider. Document Released: 05/09/2009 Document Revised: 12/19/2015 Document Reviewed: 08/17/2014 Elsevier Interactive Patient Education  2017 [employer].

## 2021-04-03 NOTE — Progress Notes (Signed)
I connected with Tara Collins today by telephone and verified that I am speaking with the correct person using two identifiers. Location patient: home Location provider: work Persons participating in the virtual visit: Sherol, Sabas LPN.   I discussed the limitations, risks, security and privacy concerns of performing an evaluation and management service by telephone and the availability of in person appointments. I also discussed with the patient that there may be a patient responsible charge related to this service. The patient expressed understanding and verbally consented to this telephonic visit.    Interactive audio and video telecommunications were attempted between this provider and patient, however failed, due to patient having technical difficulties OR patient did not have access to video capability.  We continued and completed visit with audio only.     Vital signs may be patient reported or missing.  Subjective:   Tara Collins is a 86 y.o. female who presents for Medicare Annual (Subsequent) preventive examination.  Review of Systems     Cardiac Risk Factors include: advanced age (>72men, >54 women);dyslipidemia;hypertension;sedentary lifestyle     Objective:    Today's Vitals   04/03/21 1056  Weight: 145 lb (65.8 kg)  Height: 5' (1.524 m)   Body mass index is 28.32 kg/m.  Advanced Directives 04/03/2021 03/21/2020 03/16/2019 07/26/2018 07/05/2018 06/28/2018  Does Patient Have a Medical Advance Directive? No No No No No No  Would patient like information on creating a medical advance directive? Yes (MAU/Ambulatory/Procedural Areas - Information given) - - No - Patient declined No - Patient declined No - Patient declined    Current Medications (verified) Outpatient Encounter Medications as of 04/03/2021  Medication Sig   amLODipine (NORVASC) 10 MG tablet TAKE 1 TABLET EVERY DAY   benzonatate (TESSALON PERLES) 100 MG capsule Take 1 capsule (100 mg total) by  mouth every 6 (six) hours as needed for cough.   carboxymethylcellulose (REFRESH PLUS) 0.5 % SOLN Place 1 drop into both eyes daily as needed (dry eyes).   cetirizine (ZYRTEC ALLERGY) 10 MG tablet Take 1 tablet (10 mg total) by mouth daily.   diclofenac Sodium (VOLTAREN) 1 % GEL Apply 2 g topically 4 (four) times daily.   losartan (COZAAR) 100 MG tablet TAKE 1 TABLET EVERY DAY   lovastatin (MEVACOR) 40 MG tablet TAKE 1 TABLET EVERY DAY   potassium chloride (MICRO-K) 10 MEQ CR capsule TAKE 1 CAPSULE EVERY DAY   traZODone (DESYREL) 50 MG tablet TAKE 1 TABLET(50 MG) BY MOUTH AT BEDTIME AS NEEDED FOR SLEEP   No facility-administered encounter medications on file as of 04/03/2021.    Allergies (verified) Contrast media [iodinated diagnostic agents] and Latex   History: Past Medical History:  Diagnosis Date   Arthritis    Hepatitis B 03/1963   Hypertension    PONV (postoperative nausea and vomiting)    Primary localized osteoarthritis of right knee 07/05/2018   Seasonal allergies    Past Surgical History:  Procedure Laterality Date   ABDOMINAL HYSTERECTOMY  01/1991   APPENDECTOMY     CATARACT EXTRACTION, BILATERAL     CHOLECYSTECTOMY  10/2017   PARTIAL KNEE ARTHROPLASTY Right 07/05/2018   Procedure: UNICOMPARTMENTAL KNEE;  Surgeon: Teryl Desa, MD;  Location: [facility];  Service: Orthopedics;  Laterality: Right;   REPLACEMENT TOTAL KNEE Left 2012   History reviewed. No pertinent family history. Social History   Socioeconomic History   Marital status: Single    Spouse name: Not on file   Number of children: Not on  file   Years of education: Not on file   Highest education level: Not on file  Occupational History   Occupation: retired  Tobacco Use   Smoking status: Never   Smokeless tobacco: Never  Vaping Use   Vaping Use: Never used  Substance and Sexual Activity   Alcohol use: Not Currently   Drug use: Not Currently   Sexual activity: Not Currently  Other Topics Concern    Not on file  Social History Narrative   Not on file   Social Determinants of Health   Financial Resource Strain: Low Risk    Difficulty of Paying Living Expenses: Not hard at all  Food Insecurity: No Food Insecurity   Worried About Programme researcher, broadcasting/film/videounning Out of Food in the Last Year: Never true   Ran Out of Food in the Last Year: Never true  Transportation Needs: No Transportation Needs   Lack of Transportation (Medical): No   Lack of Transportation (Non-Medical): No  Physical Activity: Inactive   Days of Exercise per Week: 0 days   Minutes of Exercise per Session: 0 min  Stress: No Stress Concern Present   Feeling of Stress : Not at all  Social Connections: Not on file    Tobacco Counseling Counseling given: Not Answered   Clinical Intake:  Pre-visit preparation completed: Yes  Pain : No/denies pain     Nutritional Status: BMI 25 -29 Overweight Nutritional Risks: Nausea/ vomitting/ diarrhea (loose stools sometimes depends on what she eats) Diabetes: No  How often do you need to have someone help you when you read instructions, pamphlets, or other written materials from your doctor or pharmacy?: 1 - Never  Diabetic? no  Interpreter Needed?: No  Information entered by :: NAllen LPN   Activities of Daily Living In your present state of health, do you have any difficulty performing the following activities: 04/03/2021 03/27/2021  Hearing? N N  Vision? N N  Difficulty concentrating or making decisions? N N  Walking or climbing stairs? Y Y  Dressing or bathing? N Y  Doing errands, shopping? Y Y  Comment does not drive Chief Operating Officer-  Preparing Food and eating ? N -  Using the Toilet? N -  In the past six months, have you accidently leaked urine? N -  Do you have problems with loss of bowel control? Y -  Managing your Medications? N -  Managing your Finances? N -  Housekeeping or managing your Housekeeping? N -  Some recent data might be hidden    Patient Care Team: Arnette FeltsMoore, Janece, FNP  as PCP - General (General Practice)  Indicate any recent Medical Services you may have received from other than [facility] providers in the past year (date may be approximate).     Assessment:   This is a routine wellness examination for Amaurie.  Hearing/Vision screen Vision Screening - Comments:: [facility]  Dietary issues and exercise activities discussed: Current Exercise Habits: The patient does not participate in regular exercise at present   Goals Addressed             This Visit's Progress    Patient Stated       04/03/2021, want to eat healthy       Depression Screen PHQ 2/9 Scores 04/03/2021 03/21/2020 03/21/2020 03/16/2019 06/20/2018  PHQ - 2 Score 0 0 0 0 0  PHQ- 9 Score - - - 1 -    Fall Risk Fall Risk  04/03/2021 03/27/2021 03/21/2020 03/21/2020 03/16/2019  Falls  in the past year? 0 0 0 0 0  Number falls in past yr: - 0 - - -  Injury with Fall? - 0 - - -  Risk for fall due to : Medication side effect No Fall Risks Medication side effect;Impaired balance/gait - Medication side effect  Follow up Falls evaluation completed;Education provided;Falls prevention discussed Falls evaluation completed Falls evaluation completed;Education provided;Falls prevention discussed - Falls evaluation completed;Education provided;Falls prevention discussed    FALL RISK PREVENTION PERTAINING TO THE HOME:  Any stairs in or around the home? No  If so, are there any without handrails?  N/a Home free of loose throw rugs in walkways, pet beds, electrical cords, etc? Yes  Adequate lighting in your home to reduce risk of falls? Yes   ASSISTIVE DEVICES UTILIZED TO PREVENT FALLS:  Life alert? No  Use of a cane, walker or w/c? Yes  Grab bars in the bathroom? No  Shower chair or bench in shower? No  Elevated toilet seat or a handicapped toilet? Yes   TIMED UP AND GO:  Was the test performed? No .       Cognitive Function:     6CIT Screen 04/03/2021 03/21/2020 03/16/2019   What Year? 0 points 0 points 0 points  What month? 0 points 0 points 0 points  What time? 0 points 0 points 0 points  Count back from 20 0 points 0 points 0 points  Months in reverse 0 points 2 points 0 points  Repeat phrase 10 points 6 points 0 points  Total Score 10 8 0    Immunizations Immunization History  Administered Date(s) Administered   Influenza Whole 07/27/2006   PFIZER(Purple Top)SARS-COV-2 Vaccination 09/09/2019, 10/04/2019, 06/15/2020   Pneumococcal Polysaccharide-23 11/29/2008    TDAP status: Due, Education has been provided regarding the importance of this vaccine. Advised may receive this vaccine at local pharmacy or Health Dept. Aware to provide a copy of the vaccination record if obtained from local pharmacy or Health Dept. Verbalized acceptance and understanding.  Flu Vaccine status: Declined, Education has been provided regarding the importance of this vaccine but patient still declined. Advised may receive this vaccine at local pharmacy or Health Dept. Aware to provide a copy of the vaccination record if obtained from local pharmacy or Health Dept. Verbalized acceptance and understanding.  Pneumococcal vaccine status: Declined,  Education has been provided regarding the importance of this vaccine but patient still declined. Advised may receive this vaccine at local pharmacy or Health Dept. Aware to provide a copy of the vaccination record if obtained from local pharmacy or Health Dept. Verbalized acceptance and understanding.   Covid-19 vaccine status: Completed vaccines  Qualifies for Shingles Vaccine? Yes   Zostavax completed No   Shingrix Completed?: No.    Education has been provided regarding the importance of this vaccine. Patient has been advised to call insurance company to determine out of pocket expense if they have not yet received this vaccine. Advised may also receive vaccine at local pharmacy or Health Dept. Verbalized acceptance and  understanding.  Screening Tests Health Maintenance  Topic Date Due   COVID-19 Vaccine (4 - Booster for Pfizer series) 10/13/2020   Zoster Vaccines- Shingrix (1 of 2) 06/26/2021 (Originally 01/12/1987)   INFLUENZA VACCINE  10/24/2021 (Originally 02/24/2021)   TETANUS/TDAP  03/27/2022 (Originally 01/12/1954)   PNA vac Low Risk Adult (2 of 2 - PCV13) 03/27/2022 (Originally 11/29/2009)   DEXA SCAN  Completed   HPV VACCINES  Aged Out    Health  Maintenance  Health Maintenance Due  Topic Date Due   COVID-19 Vaccine (4 - Booster for Pfizer series) 10/13/2020    Colorectal cancer screening: No longer required.   Mammogram status: No longer required due to age.  Bone Density status: Completed 12/07/2008.   Lung Cancer Screening: (Low Dose CT Chest recommended if Age 89-80 years, 30 pack-year currently smoking OR have quit w/in 15years.) does not qualify.   Lung Cancer Screening Referral: no  Additional Screening:  Hepatitis C Screening: does not qualify;  Vision Screening: Recommended annual ophthalmology exams for early detection of glaucoma and other disorders of the eye. Is the patient up to date with their annual eye exam?  Yes  Who is the provider or what is the name of the office in which the patient attends annual eye exams? [facility] If pt is not established with a provider, would they like to be referred to a provider to establish care? No .   Dental Screening: Recommended annual dental exams for proper oral hygiene  Community Resource Referral / Chronic Care Management: CRR required this visit?  No   CCM required this visit?  No      Plan:     I have personally reviewed and noted the following in the patient's chart:   Medical and social history Use of alcohol, tobacco or illicit drugs  Current medications and supplements including opioid prescriptions.  Functional ability and status Nutritional status Physical activity Advanced directives List of other  physicians Hospitalizations, surgeries, and ER visits in previous 12 months Vitals Screenings to include cognitive, depression, and falls Referrals and appointments  In addition, I have reviewed and discussed with patient certain preventive protocols, quality metrics, and best practice recommendations. A written personalized care plan for preventive services as well as general preventive health recommendations were provided to patient.     Barb Merino, LPN   04/01/7892   Nurse Notes:

## 2021-04-09 ENCOUNTER — Ambulatory Visit: Payer: Medicare HMO | Admitting: Nurse Practitioner

## 2021-05-22 ENCOUNTER — Other Ambulatory Visit: Payer: Self-pay | Admitting: Nurse Practitioner

## 2021-05-22 DIAGNOSIS — E876 Hypokalemia: Secondary | ICD-10-CM

## 2021-09-24 ENCOUNTER — Ambulatory Visit: Payer: Medicare HMO | Admitting: Nurse Practitioner

## 2021-10-13 ENCOUNTER — Other Ambulatory Visit: Payer: Self-pay | Admitting: Nurse Practitioner

## 2021-10-13 DIAGNOSIS — E876 Hypokalemia: Secondary | ICD-10-CM

## 2021-12-24 ENCOUNTER — Other Ambulatory Visit: Payer: Self-pay | Admitting: Nurse Practitioner

## 2021-12-24 DIAGNOSIS — I10 Essential (primary) hypertension: Secondary | ICD-10-CM

## 2021-12-24 DIAGNOSIS — E785 Hyperlipidemia, unspecified: Secondary | ICD-10-CM

## 2022-03-07 ENCOUNTER — Other Ambulatory Visit: Payer: Self-pay | Admitting: Nurse Practitioner

## 2022-03-07 DIAGNOSIS — E876 Hypokalemia: Secondary | ICD-10-CM

## 2022-03-27 ENCOUNTER — Emergency Department (HOSPITAL_COMMUNITY)
Admission: EM | Admit: 2022-03-27 | Discharge: 2022-03-27 | Disposition: A | Discharge status: Home / Self Care | Payer: Medicare HMO | Attending: Emergency Medicine | Admitting: Emergency Medicine

## 2022-03-27 ENCOUNTER — Other Ambulatory Visit: Payer: Self-pay

## 2022-03-27 ENCOUNTER — Encounter (HOSPITAL_COMMUNITY): Payer: Self-pay

## 2022-03-27 DIAGNOSIS — I10 Essential (primary) hypertension: Secondary | ICD-10-CM | POA: Insufficient documentation

## 2022-03-27 DIAGNOSIS — R002 Palpitations: Secondary | ICD-10-CM

## 2022-03-27 DIAGNOSIS — Z79899 Other long term (current) drug therapy: Secondary | ICD-10-CM | POA: Diagnosis not present

## 2022-03-27 DIAGNOSIS — Z91040 Latex allergy status: Secondary | ICD-10-CM | POA: Insufficient documentation

## 2022-03-27 LAB — BASIC METABOLIC PANEL
Anion gap: 7 (ref 5–15)
BUN: 11 mg/dL (ref 8–23)
CO2: 25 mmol/L (ref 22–32)
Calcium: 9.1 mg/dL (ref 8.9–10.3)
Chloride: 109 mmol/L (ref 98–111)
Creatinine, Ser: 0.88 mg/dL (ref 0.44–1.00)
GFR, Estimated: >60 mL/min (ref >60)
Glucose, Bld: 96 mg/dL (ref 70–99)
Potassium: 3.4 mmol/L — ABNORMAL LOW (ref 3.5–5.1)
Sodium: 141 mmol/L (ref 135–145)

## 2022-03-27 LAB — CBC WITH DIFFERENTIAL/PLATELET
Abs Immature Granulocytes: 0.02 10*3/uL (ref 0.00–0.07)
Basophils Absolute: 0 10*3/uL (ref 0.0–0.1)
Basophils Relative: 0 %
Eosinophils Absolute: 0.1 10*3/uL (ref 0.0–0.5)
Eosinophils Relative: 2 %
HCT: 37.8 % (ref 36.0–46.0)
Hemoglobin: 13.1 g/dL (ref 12.0–15.0)
Immature Granulocytes: 0 %
Lymphocytes Relative: 38 %
Lymphs Abs: 1.8 10*3/uL (ref 0.7–4.0)
MCH: 34 pg (ref 26.0–34.0)
MCHC: 34.7 g/dL (ref 30.0–36.0)
MCV: 98.2 fL (ref 80.0–100.0)
Monocytes Absolute: 0.4 10*3/uL (ref 0.1–1.0)
Monocytes Relative: 9 %
Neutro Abs: 2.4 10*3/uL (ref 1.7–7.7)
Neutrophils Relative %: 51 %
Platelets: 240 10*3/uL (ref 150–400)
RBC: 3.85 MIL/uL — ABNORMAL LOW (ref 3.87–5.11)
RDW: 12.4 % (ref 11.5–15.5)
WBC: 4.8 10*3/uL (ref 4.0–10.5)
nRBC: 0 % (ref 0.0–0.2)

## 2022-03-27 LAB — MAGNESIUM: Magnesium: 2.3 mg/dL (ref 1.7–2.4)

## 2022-03-27 MED ORDER — MAGNESIUM SULFATE 2 GM/50ML IV SOLN
2.0000 g | Freq: Once | INTRAVENOUS | Status: DC
Start: 2022-03-27 — End: 2022-03-27
  Administered 2022-03-27: 2 g via INTRAVENOUS
  Filled 2022-03-27: qty 50

## 2022-03-27 NOTE — ED Provider Notes (Signed)
Supervise resident visit.  Patient is 28.  History of hypertension.  Here with palpitations that started today.  EKG shows sinus rhythm with PVCs.  She is having some intermittent PVCs per my review of her telemetry.  She has no chest pain or shortness of breath.  Denies any nausea, vomiting, diarrhea.  Overall she appears well.  Unremarkable vitals.  Will check electrolytes including CBC and BMP.  Overall believe these are benign PVCs that are causing her symptoms.  She is not tachycardic.  She is minimally symptomatic.  Anticipate discharge if lab works unremarkable.  May need some electrolyte repletion.  We will have her follow-up with cardiology.  This chart was dictated using voice recognition software.  Despite best efforts to proofread,  errors can occur which can change the documentation meaning.    Virgina Norfolk, DO 03/27/22 1942

## 2022-03-27 NOTE — ED Provider Notes (Signed)
[facility] Provider Note   CSN: [id number] Arrival date & time: 03/27/22  1848     History  Chief Complaint  Patient presents with   Palpitations    [facility] is a 87 y.o. female. Presenting due to palpitations over the past 3 days.  It was initially more severe 3 days ago, however it was intermittent.  Today it was milder, but was more persistent.  She has consistently felt a fluttery feeling in her chest, but intermittently she feels like her heart is racing as well.  She denies any chest pain or shortness of breath.  She feels anxious whenever she feels her heart racing.  She denies any fever, recent medication changes, fall, lightheadedness, or dizziness. HPI     Home Medications Prior to Admission medications   Medication Sig Start Date End Date Taking? Authorizing Provider  amLODipine (NORVASC) 10 MG tablet TAKE 1 TABLET EVERY DAY 12/24/21   [name], FNP  carboxymethylcellulose (REFRESH PLUS) 0.5 % SOLN Place 1 drop into both eyes daily as needed (dry eyes).    [provider]  cetirizine (ZYRTEC ALLERGY) 10 MG tablet Take 1 tablet (10 mg total) by mouth daily. 09/26/20 09/26/21  [name], FNP  diclofenac Sodium (VOLTAREN) 1 % GEL Apply 2 g topically 4 (four) times daily. 10/23/19   [name], FNP  losartan (COZAAR) 100 MG tablet TAKE 1 TABLET EVERY DAY 12/24/21   [name], FNP  lovastatin (MEVACOR) 40 MG tablet TAKE 1 TABLET EVERY DAY 12/24/21   [name], FNP  potassium chloride (MICRO-K) 10 MEQ CR capsule TAKE 1 CAPSULE EVERY DAY 03/09/22   [name], FNP  traZODone (DESYREL) 50 MG tablet TAKE 1 TABLET(50 MG) BY MOUTH AT BEDTIME AS NEEDED FOR SLEEP 09/09/20   [name], FNP      Allergies    Contrast media [iodinated contrast media] and Latex    Review of Systems   Review of Systems  Constitutional:  Negative for chills and fever.  HENT:  Negative for ear pain and sore throat.   Eyes:   Negative for pain and visual disturbance.  Respiratory:  Negative for cough and shortness of breath.   Cardiovascular:  Positive for palpitations. Negative for chest pain.  Gastrointestinal:  Negative for abdominal pain and vomiting.  Genitourinary:  Negative for dysuria and hematuria.  Musculoskeletal:  Negative for arthralgias and back pain.  Skin:  Negative for color change and rash.  Neurological:  Negative for seizures and syncope.  All other systems reviewed and are negative.   Physical Exam Updated Vital Signs BP (!) 159/60   Pulse 64   Temp 98.9 F (37.2 C) (Oral)   Resp (!) 25   Ht 5' (1.524 m)   Wt 65.8 kg   SpO2 98%   BMI 28.33 kg/m  Physical Exam Vitals and nursing note reviewed.  Constitutional:      General: She is not in acute distress.    Appearance: She is well-developed.  HENT:     Head: Normocephalic and atraumatic.  Eyes:     Conjunctiva/sclera: Conjunctivae normal.  Cardiovascular:     Rate and Rhythm: Normal rate and regular rhythm.     Heart sounds: No murmur heard. Pulmonary:     Effort: Pulmonary effort is normal. No respiratory distress.     Breath sounds: Normal breath sounds.  Abdominal:     Palpations: Abdomen is soft.     Tenderness: There is no abdominal  tenderness.  Musculoskeletal:        General: No swelling.     Cervical back: Neck supple.  Skin:    General: Skin is warm and dry.     Capillary Refill: Capillary refill takes less than 2 seconds.  Neurological:     Mental Status: She is alert.  Psychiatric:        Mood and Affect: Mood normal.     ED Results / Procedures / Treatments   Labs (all labs ordered are listed, but only abnormal results are displayed) Labs Reviewed  CBC WITH DIFFERENTIAL/PLATELET - Abnormal; Notable for the following components:      Result Value   RBC 3.85 (*)    All other components within normal limits  BASIC METABOLIC PANEL - Abnormal; Notable for the following components:   Potassium 3.4 (*)     All other components within normal limits  MAGNESIUM    EKG EKG Interpretation  Date/Time:  Friday March 27 2022 18:49:15 EDT Ventricular Rate:  66 PR Interval:  139 QRS Duration: 81 QT Interval:  433 QTC Calculation: 454 R Axis:   16 Text Interpretation: Sinus rhythm Multiple ventricular premature complexes Confirmed by Lockie Mola, Adam ([id number]) on 03/27/2022 7:00:08 PM  Radiology No results found.  Procedures Procedures    Medications Ordered in ED Medications - No data to display  ED Course/ Medical Decision Making/ A&P                           Medical Decision Making Amount and/or Complexity of Data Reviewed Labs: ordered.  Risk Prescription drug management.   87 year old female with past medical history of hypertension presenting with palpitations x3 days.  On arrival she has stable vital signs.  She reports compliance with all her medications and denies any new medications.  Differential diagnosis includes arrhythmia, electrolyte abnormalities, dehydration, AKI, anemia.  EKG obtained and reviewed and found to have sinus rhythm with PVCs.  Labs obtained and found to be reassuring.  No AKI, significant Electra abnormalities, or anemia.    Overall, patient has a reassuring work-up today.  Low suspicion for emergent condition.  Low suspicion for ACS as patient has a nonischemic EKG and no chest pain.  Palpitations likely due to PVCs.  Patient reports improvement of her symptoms.  Recommend close follow-up with primary doctor and to follow-up with cardiology on the outpatient basis.  Patient agreeable to this plan.        Final Clinical Impression(s) / ED Diagnoses Final diagnoses:  Essential hypertension  Palpitations    Rx / DC Orders ED Discharge Orders          Ordered    Ambulatory referral to Cardiology       Comments: If you have not heard from the Cardiology office within the next 72 hours please call [phone].   03/27/22 1940               Kela Millin, MD 03/27/22 2050    Virgina Norfolk, DO 03/27/22 2357

## 2022-03-27 NOTE — ED Triage Notes (Signed)
Pt arrived via [facility]. Per EMS, pt started feeling Wednesday like her heart was skipping a bit, EKG by EMS shows PACs. Sym,ptoms worse with exertion

## 2022-03-27 NOTE — Discharge Instructions (Signed)
Follow-up with cardiology.  We have put in referral for you to follow-up with a cardiologist for your palpitations.  Overall please return if you develop any significant chest pain or worsening symptoms.

## 2022-03-29 DIAGNOSIS — I10 Essential (primary) hypertension: Secondary | ICD-10-CM | POA: Diagnosis not present

## 2022-03-29 DIAGNOSIS — R002 Palpitations: Secondary | ICD-10-CM | POA: Diagnosis not present

## 2022-03-29 DIAGNOSIS — R457 State of emotional shock and stress, unspecified: Secondary | ICD-10-CM | POA: Diagnosis not present

## 2022-03-29 DIAGNOSIS — R0689 Other abnormalities of breathing: Secondary | ICD-10-CM | POA: Diagnosis not present

## 2022-03-31 DIAGNOSIS — I10 Essential (primary) hypertension: Secondary | ICD-10-CM | POA: Diagnosis not present

## 2022-03-31 DIAGNOSIS — E785 Hyperlipidemia, unspecified: Secondary | ICD-10-CM | POA: Diagnosis not present

## 2022-03-31 DIAGNOSIS — R002 Palpitations: Secondary | ICD-10-CM | POA: Diagnosis not present

## 2022-04-01 ENCOUNTER — Telehealth: Payer: Self-pay

## 2022-04-01 DIAGNOSIS — I10 Essential (primary) hypertension: Secondary | ICD-10-CM | POA: Diagnosis not present

## 2022-04-01 DIAGNOSIS — E785 Hyperlipidemia, unspecified: Secondary | ICD-10-CM | POA: Diagnosis not present

## 2022-04-01 DIAGNOSIS — R002 Palpitations: Secondary | ICD-10-CM | POA: Diagnosis not present

## 2022-04-01 NOTE — Telephone Encounter (Signed)
Transition Care Management Unsuccessful Follow-up Telephone Call  Date of discharge and from where:  03/27/2022 [facility]   Attempts:  1st Attempt  Reason for unsuccessful TCM follow-up call:  Left voice message

## 2022-04-02 LAB — LIPID PANEL
HDL: 55 (ref 35–70)
LDL Cholesterol: 140
LDl/HDL Ratio: 2.5
Triglycerides: 71 (ref 40–160)

## 2022-04-02 LAB — COMPREHENSIVE METABOLIC PANEL
Albumin: 4.3 (ref 3.5–5.0)
Calcium: 10.3 (ref 8.7–10.7)
Globulin: 2.9

## 2022-04-02 LAB — BASIC METABOLIC PANEL
CO2: 26 — AB (ref 13–22)
Chloride: 109 — AB (ref 99–108)
Potassium: 4.2 mEq/L (ref 3.5–5.1)
Sodium: 146 (ref 137–147)

## 2022-04-02 LAB — HEPATIC FUNCTION PANEL
ALT: 13 U/L (ref 7–35)
AST: 19 (ref 13–35)
Bilirubin, Direct: 0.1 (ref 0.01–0.4)
Bilirubin, Total: 0.6

## 2022-04-08 ENCOUNTER — Ambulatory Visit (INDEPENDENT_AMBULATORY_CARE_PROVIDER_SITE_OTHER): Payer: Medicare HMO | Admitting: Nurse Practitioner

## 2022-04-08 ENCOUNTER — Encounter: Payer: Self-pay | Admitting: Nurse Practitioner

## 2022-04-08 VITALS — BP 130/76 | HR 66 | Temp 98.3°F | Ht 60.0 in | Wt 149.8 lb

## 2022-04-08 DIAGNOSIS — R0683 Snoring: Secondary | ICD-10-CM

## 2022-04-08 DIAGNOSIS — Z0000 Encounter for general adult medical examination without abnormal findings: Secondary | ICD-10-CM | POA: Diagnosis not present

## 2022-04-08 DIAGNOSIS — Z2821 Immunization not carried out because of patient refusal: Secondary | ICD-10-CM

## 2022-04-08 DIAGNOSIS — Z6829 Body mass index (BMI) 29.0-29.9, adult: Secondary | ICD-10-CM | POA: Diagnosis not present

## 2022-04-08 DIAGNOSIS — I10 Essential (primary) hypertension: Secondary | ICD-10-CM | POA: Diagnosis not present

## 2022-04-08 DIAGNOSIS — E785 Hyperlipidemia, unspecified: Secondary | ICD-10-CM

## 2022-04-08 LAB — POCT URINALYSIS DIPSTICK
Bilirubin, UA: NEGATIVE
Blood, UA: NEGATIVE
Glucose, UA: NEGATIVE
Ketones, UA: NEGATIVE
Nitrite, UA: NEGATIVE
Protein, UA: NEGATIVE
Spec Grav, UA: <=1.005 — AB (ref 1.010–1.025)
Urobilinogen, UA: 0.2 E.U./dL
pH, UA: 6.5 (ref 5.0–8.0)

## 2022-04-08 MED ORDER — LOSARTAN POTASSIUM 100 MG PO TABS: 100.0000 mg | ORAL_TABLET | Freq: Every day | ORAL | 1 refills | Status: DC

## 2022-04-08 MED ORDER — AMLODIPINE BESYLATE 10 MG PO TABS: 10.0000 mg | ORAL_TABLET | Freq: Every day | ORAL | 1 refills | Status: DC

## 2022-04-08 MED ORDER — DICLOFENAC SODIUM 1 % EX GEL: 2.0000 g | Freq: Four times a day (QID) | CUTANEOUS | 1 refills | Status: DC

## 2022-04-08 MED ORDER — LOVASTATIN 40 MG PO TABS: 40.0000 mg | ORAL_TABLET | Freq: Every day | ORAL | 1 refills | Status: DC

## 2022-04-08 NOTE — Progress Notes (Signed)
Hershal Coria Martin,acting as a Neurosurgeon for [name], FNP.,have documented all relevant documentation on the behalf of [name], FNP,as directed by  [name], FNP while in the presence of [name], FNP.   Subjective:     Patient ID: Tara Collins , female    DOB: February 14, 1935 , 87 y.o.   MRN: [id number]   Chief Complaint  Patient presents with   Annual Exam    HPI  Patient presents today for HM. She was seen in the ER on Sept 1 for palpitations. She seen Dr. Sharyn Lull last week with labs. She feels like she was very busy during that week and overexerted herself.   Reports she has to sit up to sleep.        Past Medical History:  Diagnosis Date   Arthritis    Hepatitis B 03/1963   Hypertension    PONV (postoperative nausea and vomiting)    Primary localized osteoarthritis of right knee 07/05/2018   Seasonal allergies      No family history on file.   Current Outpatient Medications:    cetirizine (ZYRTEC ALLERGY) 10 MG tablet, Take 1 tablet (10 mg total) by mouth daily., Disp: 30 tablet, Rfl: 2   potassium chloride (MICRO-K) 10 MEQ CR capsule, TAKE 1 CAPSULE EVERY DAY, Disp: 90 capsule, Rfl: 0   traZODone (DESYREL) 50 MG tablet, TAKE 1 TABLET(50 MG) BY MOUTH AT BEDTIME AS NEEDED FOR SLEEP, Disp: 30 tablet, Rfl: 3   amLODipine (NORVASC) 10 MG tablet, Take 1 tablet (10 mg total) by mouth daily., Disp: 90 tablet, Rfl: 1   diclofenac Sodium (VOLTAREN) 1 % GEL, Apply 2 g topically 4 (four) times daily., Disp: 100 g, Rfl: 1   losartan (COZAAR) 100 MG tablet, Take 1 tablet (100 mg total) by mouth daily., Disp: 90 tablet, Rfl: 1   lovastatin (MEVACOR) 40 MG tablet, Take 1 tablet (40 mg total) by mouth daily., Disp: 90 tablet, Rfl: 1   Allergies  Allergen Reactions   Contrast Media [Iodinated Contrast Media] Shortness Of Breath    ALLERGY ADDED FROM QUESTIONING PT  SHE HAD A PREVIOUS REACTION TO CONTRAST    Latex Rash    "skin burns"      The patient states she is  status post hysterectomy. Negative for: breast discharge, breast lump(s), breast pain and breast self exam. Associated symptoms include abnormal vaginal bleeding. Pertinent negatives include abnormal bleeding (hematology), anxiety, decreased libido, depression, difficulty falling sleep, dyspareunia, history of infertility, nocturia, sexual dysfunction, sleep disturbances, urinary incontinence, urinary urgency, vaginal discharge and vaginal itching. 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   The patient's tobacco use is:  Social History   Tobacco Use  Smoking Status Never  Smokeless Tobacco Never   She has been exposed to passive smoke. The patient's alcohol use is:  Social History   Substance and Sexual Activity  Alcohol Use Not Currently    Review of Systems  Constitutional: Negative.   HENT: Negative.    Eyes: Negative.   Respiratory: Negative.    Cardiovascular: Negative.   Gastrointestinal: Negative.   Endocrine: Negative.   Genitourinary: Negative.   Musculoskeletal: Negative.   Skin: Negative.   Allergic/Immunologic: Negative.   Neurological: Negative.   Hematological: Negative.   Psychiatric/Behavioral: Negative.       Today's Vitals   04/08/22 0838  BP: 130/76  Pulse: 66  Temp:  98.3 F (36.8 C)  Weight: 149 lb 12.8 oz (67.9 kg)  Height: 5' (1.524 m)  PainSc: 0-No pain   Body mass index is 29.26 kg/m.  Wt Readings from Last 3 Encounters:  04/08/22 149 lb 12.8 oz (67.9 kg)  03/27/22 145 lb 1 oz (65.8 kg)  04/03/21 145 lb (65.8 kg)     Objective:  Physical Exam Vitals reviewed.  Constitutional:      General: She is not in acute distress.    Appearance: Normal appearance. She is well-developed. She is obese.  HENT:     Head: Normocephalic and atraumatic.     Right Ear: Hearing, tympanic membrane, ear canal and external ear normal. There is no impacted cerumen.      Left Ear: Hearing, tympanic membrane, ear canal and external ear normal. There is no impacted cerumen.     Nose:     Comments: Deferred - masked    Mouth/Throat:     Comments: Deferred - masked Eyes:     General: Lids are normal.     Extraocular Movements: Extraocular movements intact.     Conjunctiva/sclera: Conjunctivae normal.     Pupils: Pupils are equal, round, and reactive to light.     Funduscopic exam:    Right eye: No papilledema.        Left eye: No papilledema.  Neck:     Thyroid: No thyroid mass.     Vascular: No carotid bruit.  Cardiovascular:     Rate and Rhythm: Normal rate and regular rhythm.     Pulses: Normal pulses.     Heart sounds: Normal heart sounds. No murmur heard. Pulmonary:     Effort: Pulmonary effort is normal. No respiratory distress.     Breath sounds: Normal breath sounds. No wheezing.  Chest:     Chest wall: No mass.  Breasts:    Tanner Score is 5.     Right: Normal. No mass or tenderness.     Left: Normal. No mass or tenderness.  Abdominal:     General: Abdomen is flat. Bowel sounds are normal. There is no distension.     Palpations: Abdomen is soft.     Tenderness: There is no abdominal tenderness.  Musculoskeletal:        General: No swelling or tenderness (chronic bilateral knees). Normal range of motion.     Cervical back: Full passive range of motion without pain, normal range of motion and neck supple.     Right lower leg: No edema.     Left lower leg: No edema.  Lymphadenopathy:     Upper Body:     Right upper body: No supraclavicular, axillary or pectoral adenopathy.     Left upper body: No supraclavicular, axillary or pectoral adenopathy.  Skin:    General: Skin is warm and dry.     Capillary Refill: Capillary refill takes less than 2 seconds.  Neurological:     General: No focal deficit present.     Mental Status: She is alert and oriented to person, place, and time.     Cranial Nerves: No cranial nerve deficit.      Sensory: No sensory deficit.  Psychiatric:        Mood and Affect: Mood normal.        Behavior: Behavior normal.        Thought Content: Thought content normal.        Judgment: Judgment normal.         Assessment  And Plan:     1. Annual physical exam Behavior modifications discussed and diet history reviewed.   Pt will continue to exercise regularly and modify diet with low GI, plant based foods and decrease intake of processed foods.  Recommend intake of daily multivitamin, Vitamin D, and calcium.  Recommend mammogram for preventive screenings, as well as recommend immunizations that include influenza, TDAP, and Shingles (declined) - Microalbumin / Creatinine Urine Ratio - POCT Urinalysis Dipstick (81002)  2. Herpes zoster vaccination declined Declines shingrix, educated on disease process and is aware if he changes his mind to notify office  3. Influenza vaccination declined Patient declined influenza vaccination at this time. Patient is aware that influenza vaccine prevents illness in 70% of healthy people, and reduces hospitalizations to 30-70% in elderly. This vaccine is recommended annually. Education has been provided regarding the importance of this vaccine but patient still declined. Advised may receive this vaccine at local pharmacy or Health Dept.or vaccine clinic. Aware to provide a copy of the vaccination record if obtained from local pharmacy or Health Dept.  Pt is willing to accept risk associated with refusing vaccination.   4. Tetanus, diphtheria, and acellular pertussis (Tdap) vaccination declined She is aware if has laceration or injury to skin to cause an opening to call for Tdap  5. Pneumococcal vaccination declined Discussed risk of hospitalization with patients over age 34 with chronic health problems  6. BMI 29.0-29.9,adult  7. Essential hypertension Comments: Blood pressure is controlled, continue current medications and follow up with Dr. Sharyn Lull -  amLODipine (NORVASC) 10 MG tablet; Take 1 tablet (10 mg total) by mouth daily.  Dispense: 90 tablet; Refill: 1 - losartan (COZAAR) 100 MG tablet; Take 1 tablet (100 mg total) by mouth daily.  Dispense: 90 tablet; Refill: 1 - lovastatin (MEVACOR) 40 MG tablet; Take 1 tablet (40 mg total) by mouth daily.  Dispense: 90 tablet; Refill: 1  8. Dyslipidemia Comments: Controlled, continue statin. Tolerating well.  - Lipid panel - lovastatin (MEVACOR) 40 MG tablet; Take 1 tablet (40 mg total) by mouth daily.  Dispense: 90 tablet; Refill: 1 - diclofenac Sodium (VOLTAREN) 1 % GEL; Apply 2 g topically 4 (four) times daily.  Dispense: 100 g; Refill: 1  9. Snoring Comments: Will sleep sitting up due to being uncomfortable as well, will refer for sleep study - Ambulatory referral to Sleep Studies    Patient was given opportunity to ask questions. Patient verbalized understanding of the plan and was able to repeat key elements of the plan. All questions were answered to their satisfaction.   [name], FNP   I, [name], FNP, have reviewed all documentation for this visit. The documentation on 04/08/22 for the exam, diagnosis, procedures, and orders are all accurate and complete.   THE PATIENT IS ENCOURAGED TO PRACTICE SOCIAL DISTANCING DUE TO THE COVID-19 PANDEMIC.

## 2022-04-08 NOTE — Patient Instructions (Signed)

## 2022-04-09 LAB — LIPID PANEL
Chol/HDL Ratio: 2.6 ratio (ref 0.0–4.4)
Cholesterol, Total: 120 mg/dL (ref 100–199)
HDL: 47 mg/dL (ref >39)
LDL Chol Calc (NIH): 57 mg/dL (ref 0–99)
Triglycerides: 80 mg/dL (ref 0–149)
VLDL Cholesterol Cal: 16 mg/dL (ref 5–40)

## 2022-04-09 LAB — MICROALBUMIN / CREATININE URINE RATIO
Creatinine, Urine: 14.3 mg/dL
Microalb/Creat Ratio: <21 mg/g creat (ref 0–29)
Microalbumin, Urine: <3 ug/mL

## 2022-04-16 ENCOUNTER — Telehealth: Payer: Self-pay

## 2022-04-16 ENCOUNTER — Encounter: Payer: Self-pay | Admitting: Nurse Practitioner

## 2022-04-16 ENCOUNTER — Ambulatory Visit (INDEPENDENT_AMBULATORY_CARE_PROVIDER_SITE_OTHER): Payer: Medicare HMO

## 2022-04-16 VITALS — BP 132/64 | HR 64 | Temp 97.4°F | Ht 60.0 in | Wt 149.0 lb

## 2022-04-16 DIAGNOSIS — Z0000 Encounter for general adult medical examination without abnormal findings: Secondary | ICD-10-CM

## 2022-04-16 NOTE — Telephone Encounter (Signed)
   Telephone encounter was:  Unsuccessful.  04/16/2022 Name: Tara Collins MRN: [id number] DOB: April 22, 1935  Unsuccessful outbound call made today to assist with:  Food Insecurity  Outreach Attempt:  1st Attempt  A HIPAA compliant voice message was left requesting a return call.  Instructed patient to call back at [phone].  [facility] Care Guide   ??[email or web address]  ?? [phone]   Website: [email or web address]  [email or web address]  "We don't say no, we SHOW how!"         [facility]

## 2022-04-16 NOTE — Addendum Note (Signed)
Addended by: Kellie Simmering on: 04/16/2022 08:56 AM   Modules accepted: Orders

## 2022-04-16 NOTE — Progress Notes (Signed)
Subjective:   Tara Collins is a 87 y.o. female who presents for Medicare Annual (Subsequent) preventive examination.  Review of Systems     Cardiac Risk Factors include: advanced age (>67men, >30 women);dyslipidemia;hypertension     Objective:    Today's Vitals   04/16/22 0817  BP: 132/64  Pulse: 64  Temp: (!) 97.4 F (36.3 C)  TempSrc: Oral  SpO2: 98%  Weight: 149 lb (67.6 kg)  Height: 5' (1.524 m)   Body mass index is 29.1 kg/m.     04/16/2022    8:26 AM 03/27/2022    6:50 PM 04/03/2021   11:03 AM 03/21/2020   10:34 AM 03/16/2019   10:10 AM 07/26/2018    8:17 AM 07/05/2018    8:53 AM  Advanced Directives  Does Patient Have a Medical Advance Directive? No No No No No No No  Would patient like information on creating a medical advance directive? No - Patient declined No - Patient declined Yes (MAU/Ambulatory/Procedural Areas - Information given)   No - Patient declined No - Patient declined    Current Medications (verified) Outpatient Encounter Medications as of 04/16/2022  Medication Sig   amLODipine (NORVASC) 10 MG tablet Take 1 tablet (10 mg total) by mouth daily.   diclofenac Sodium (VOLTAREN) 1 % GEL Apply 2 g topically 4 (four) times daily.   losartan (COZAAR) 100 MG tablet Take 1 tablet (100 mg total) by mouth daily.   lovastatin (MEVACOR) 40 MG tablet Take 1 tablet (40 mg total) by mouth daily.   potassium chloride (MICRO-K) 10 MEQ CR capsule TAKE 1 CAPSULE EVERY DAY   traZODone (DESYREL) 50 MG tablet TAKE 1 TABLET(50 MG) BY MOUTH AT BEDTIME AS NEEDED FOR SLEEP   cetirizine (ZYRTEC ALLERGY) 10 MG tablet Take 1 tablet (10 mg total) by mouth daily.   No facility-administered encounter medications on file as of 04/16/2022.    Allergies (verified) Contrast media [iodinated contrast media] and Latex   History: Past Medical History:  Diagnosis Date   Arthritis    Hepatitis B 03/1963   Hypertension    PONV (postoperative nausea and vomiting)    Primary  localized osteoarthritis of right knee 07/05/2018   Seasonal allergies    Past Surgical History:  Procedure Laterality Date   ABDOMINAL HYSTERECTOMY  01/1991   APPENDECTOMY     CATARACT EXTRACTION, BILATERAL     CHOLECYSTECTOMY  10/2017   PARTIAL KNEE ARTHROPLASTY Right 07/05/2018   Procedure: UNICOMPARTMENTAL KNEE;  Surgeon: Marchia Bond, MD;  Location: [facility];  Service: Orthopedics;  Laterality: Right;   REPLACEMENT TOTAL KNEE Left 2012   History reviewed. No pertinent family history. Social History   Socioeconomic History   Marital status: Single    Spouse name: Not on file   Number of children: Not on file   Years of education: Not on file   Highest education level: Not on file  Occupational History   Occupation: retired  Tobacco Use   Smoking status: Never   Smokeless tobacco: Never  Vaping Use   Vaping Use: Never used  Substance and Sexual Activity   Alcohol use: Not Currently   Drug use: Not Currently   Sexual activity: Not Currently  Other Topics Concern   Not on file  Social History Narrative   Not on file   Social Determinants of Health   Financial Resource Strain: Medium Risk (04/16/2022)   Overall Financial Resource Strain (CARDIA)    Difficulty of Paying Living Expenses: Somewhat hard  Food Insecurity: No Food Insecurity (04/16/2022)   Hunger Vital Sign    Worried About Running Out of Food in the Last Year: Never true    Ran Out of Food in the Last Year: Never true  Transportation Needs: No Transportation Needs (04/16/2022)   PRAPARE - Hydrologist (Medical): No    Lack of Transportation (Non-Medical): No  Physical Activity: Inactive (04/16/2022)   Exercise Vital Sign    Days of Exercise per Week: 0 days    Minutes of Exercise per Session: 0 min  Stress: No Stress Concern Present (04/16/2022)   [place]    Feeling of Stress : Not at all  Social  Connections: Not on file    Tobacco Counseling Counseling given: Not Answered   Clinical Intake:  Pre-visit preparation completed: Yes  Pain : No/denies pain     Nutritional Status: BMI 25 -29 Overweight Nutritional Risks: None Diabetes: No  How often do you need to have someone help you when you read instructions, pamphlets, or other written materials from your doctor or pharmacy?: 1 - Never What is the last grade level you completed in school?: 12th grade  Diabetic? no  Interpreter Needed?: No  Information entered by :: NAllen LPN   Activities of Daily Living    04/16/2022    8:27 AM  In your present state of health, do you have any difficulty performing the following activities:  Hearing? 0  Vision? 0  Difficulty concentrating or making decisions? 0  Walking or climbing stairs? 0  Dressing or bathing? 0  Doing errands, shopping? 0  Preparing Food and eating ? N  Using the Toilet? N  In the past six months, have you accidently leaked urine? N  Do you have problems with loss of bowel control? N  Managing your Medications? N  Managing your Finances? N  Housekeeping or managing your Housekeeping? N    Patient Care Team: Minette Brine, FNP as PCP - General (General Practice)  Indicate any recent Medical Services you may have received from other than [facility] providers in the past year (date may be approximate).     Assessment:   This is a routine wellness examination for Tara Collins.  Hearing/Vision screen Vision Screening - Comments:: No regular eye exams, [facility]  Dietary issues and exercise activities discussed: Current Exercise Habits: The patient does not participate in regular exercise at present   Goals Addressed             This Visit's Progress    Patient Stated       04/16/2022, no goals       Depression Screen    04/16/2022    8:27 AM 04/03/2021   11:06 AM 03/21/2020   10:36 AM 03/21/2020   10:06 AM 03/16/2019   10:10 AM 06/20/2018     8:38 AM  PHQ 2/9 Scores  PHQ - 2 Score 0 0 0 0 0 0  PHQ- 9 Score     1     Fall Risk    04/16/2022    8:27 AM 04/08/2022    8:38 AM 04/03/2021   11:05 AM 03/27/2021    9:52 AM 03/21/2020   10:36 AM  Fall Risk   Falls in the past year? 0 0 0 0 0  Number falls in past yr: 0 0  0   Injury with Fall? 0 0  0   Risk for fall  due to : Medication side effect No Fall Risks Medication side effect No Fall Risks Medication side effect;Impaired balance/gait  Follow up Falls prevention discussed;Education provided;Falls evaluation completed Falls evaluation completed Falls evaluation completed;Education provided;Falls prevention discussed Falls evaluation completed Falls evaluation completed;Education provided;Falls prevention discussed    FALL RISK PREVENTION PERTAINING TO THE HOME:  Any stairs in or around the home? Yes  If so, are there any without handrails? no Home free of loose throw rugs in walkways, pet beds, electrical cords, etc? Yes  Adequate lighting in your home to reduce risk of falls? Yes   ASSISTIVE DEVICES UTILIZED TO PREVENT FALLS:  Life alert? No  Use of a cane, walker or w/c? No  Grab bars in the bathroom? No  Shower chair or bench in shower? No  Elevated toilet seat or a handicapped toilet? Yes   TIMED UP AND GO:  Was the test performed? Yes .  Length of time to ambulate 10 feet: 5 sec.   Gait steady and fast without use of assistive device  Cognitive Function:        04/16/2022    8:28 AM 04/03/2021   11:08 AM 03/21/2020   10:38 AM 03/16/2019   10:13 AM  6CIT Screen  What Year? 0 points 0 points 0 points 0 points  What month? 0 points 0 points 0 points 0 points  What time? 3 points 0 points 0 points 0 points  Count back from 20 0 points 0 points 0 points 0 points  Months in reverse 0 points 0 points 2 points 0 points  Repeat phrase 4 points 10 points 6 points 0 points  Total Score 7 points 10 points 8 points 0 points    Immunizations Immunization History   Administered Date(s) Administered   Influenza Whole 07/27/2006   PFIZER(Purple Top)SARS-COV-2 Vaccination 09/09/2019, 10/04/2019, 06/15/2020   Pneumococcal Polysaccharide-23 11/29/2008    TDAP status: Due, Education has been provided regarding the importance of this vaccine. Advised may receive this vaccine at local pharmacy or Health Dept. Aware to provide a copy of the vaccination record if obtained from local pharmacy or Health Dept. Verbalized acceptance and understanding.  Flu Vaccine status: Declined, Education has been provided regarding the importance of this vaccine but patient still declined. Advised may receive this vaccine at local pharmacy or Health Dept. Aware to provide a copy of the vaccination record if obtained from local pharmacy or Health Dept. Verbalized acceptance and understanding.  Pneumococcal vaccine status: Declined,  Education has been provided regarding the importance of this vaccine but patient still declined. Advised may receive this vaccine at local pharmacy or Health Dept. Aware to provide a copy of the vaccination record if obtained from local pharmacy or Health Dept. Verbalized acceptance and understanding.   Covid-19 vaccine status: Completed vaccines  Qualifies for Shingles Vaccine? Yes   Zostavax completed No   Shingrix Completed?: No.    Education has been provided regarding the importance of this vaccine. Patient has been advised to call insurance company to determine out of pocket expense if they have not yet received this vaccine. Advised may also receive vaccine at local pharmacy or Health Dept. Verbalized acceptance and understanding.  Screening Tests Health Maintenance  Topic Date Due   COVID-19 Vaccine (4 - Pfizer series) 08/10/2020   Zoster Vaccines- Shingrix (1 of 2) 07/08/2022 (Originally 01/12/1987)   INFLUENZA VACCINE  10/25/2022 (Originally 02/24/2022)   Pneumonia Vaccine 2565+ Years old (2 - PCV) 04/09/2023 (Originally 11/29/2009)    TETANUS/TDAP  04/09/2023 (Originally 01/12/1954)   DEXA SCAN  Completed   HPV VACCINES  Aged Out    Health Maintenance  Health Maintenance Due  Topic Date Due   COVID-19 Vaccine (4 - Pfizer series) 08/10/2020    Colorectal cancer screening: No longer required.   Mammogram status: No longer required due to age.  Bone Density status: Completed 12/07/2008  Lung Cancer Screening: (Low Dose CT Chest recommended if Age 27-80 years, 30 pack-year currently smoking OR have quit w/in 15years.) does not qualify.   Lung Cancer Screening Referral: no  Additional Screening:  Hepatitis C Screening: does not qualify;  Vision Screening: Recommended annual ophthalmology exams for early detection of glaucoma and other disorders of the eye. Is the patient up to date with their annual eye exam?  No  Who is the provider or what is the name of the office in which the patient attends annual eye exams? [facility] If pt is not established with a provider, would they like to be referred to a provider to establish care? No .   Dental Screening: Recommended annual dental exams for proper oral hygiene  Community Resource Referral / Chronic Care Management: CRR required this visit?  No   CCM required this visit?  No      Plan:     I have personally reviewed and noted the following in the patient's chart:   Medical and social history Use of alcohol, tobacco or illicit drugs  Current medications and supplements including opioid prescriptions. Patient is not currently taking opioid prescriptions. Functional ability and status Nutritional status Physical activity Advanced directives List of other physicians Hospitalizations, surgeries, and ER visits in previous 12 months Vitals Screenings to include cognitive, depression, and falls Referrals and appointments  In addition, I have reviewed and discussed with patient certain preventive protocols, quality metrics, and best practice  recommendations. A written personalized care plan for preventive services as well as general preventive health recommendations were provided to patient.     Kellie Simmering, LPN   [id number]   Nurse Notes: none

## 2022-04-16 NOTE — Patient Instructions (Signed)
[name] , Thank you for taking time to come for your Medicare Wellness Visit. I appreciate your ongoing commitment to your health goals. Please review the following plan we discussed and let me know if I can assist you in the future.   Screening recommendations/referrals: Colonoscopy: not required Mammogram: not required Bone Density: completed 12/07/2008 Recommended yearly ophthalmology/optometry visit for glaucoma screening and checkup Recommended yearly dental visit for hygiene and checkup  Vaccinations: Influenza vaccine: decline Pneumococcal vaccine: completed 11/29/2008 Tdap vaccine: decline Shingles vaccine: discussed   Covid-19: 06/15/2020, 10/04/2019, 09/09/2019  Advanced directives: Advance directive discussed with you today. Even though you declined this today please call our office should you change your mind and we can give you the proper paperwork for you to fill out.  Conditions/risks identified: none  Next appointment: Follow up in one year for your annual wellness visit    Preventive Care 65 Years and Older, Female Preventive care refers to lifestyle choices and visits with your health care provider that can promote health and wellness. What does preventive care include? A yearly physical exam. This is also called an annual well check. Dental exams once or twice a year. Routine eye exams. Ask your health care provider how often you should have your eyes checked. Personal lifestyle choices, including: Daily care of your teeth and gums. Regular physical activity. Eating a healthy diet. Avoiding tobacco and drug use. Limiting alcohol use. Practicing safe sex. Taking low-dose aspirin every day. Taking vitamin and mineral supplements as recommended by your health care provider. What happens during an annual well check? The services and screenings done by your health care provider during your annual well check will depend on your age, overall health, lifestyle risk  factors, and family history of disease. Counseling  Your health care provider may ask you questions about your: Alcohol use. Tobacco use. Drug use. Emotional well-being. Home and relationship well-being. Sexual activity. Eating habits. History of falls. Memory and ability to understand (cognition). Work and work Statistician. Reproductive health. Screening  You may have the following tests or measurements: Height, weight, and BMI. Blood pressure. Lipid and cholesterol levels. These may be checked every 5 years, or more frequently if you are over 38 years old. Skin check. Lung cancer screening. You may have this screening every year starting at age 46 if you have a 30-pack-year history of smoking and currently smoke or have quit within the past 15 years. Fecal occult blood test (FOBT) of the stool. You may have this test every year starting at age 82. Flexible sigmoidoscopy or colonoscopy. You may have a sigmoidoscopy every 5 years or a colonoscopy every 10 years starting at age 34. Hepatitis C blood test. Hepatitis B blood test. Sexually transmitted disease (STD) testing. Diabetes screening. This is done by checking your blood sugar (glucose) after you have not eaten for a while (fasting). You may have this done every 1-3 years. Bone density scan. This is done to screen for osteoporosis. You may have this done starting at age 56. Mammogram. This may be done every 1-2 years. Talk to your health care provider about how often you should have regular mammograms. Talk with your health care provider about your test results, treatment options, and if necessary, the need for more tests. Vaccines  Your health care provider may recommend certain vaccines, such as: Influenza vaccine. This is recommended every year. Tetanus, diphtheria, and acellular pertussis (Tdap, Td) vaccine. You may need a Td booster every 10 years. Zoster vaccine. You may  need this after age 2. Pneumococcal 13-valent  conjugate (PCV13) vaccine. One dose is recommended after age 48. Pneumococcal polysaccharide (PPSV23) vaccine. One dose is recommended after age 55. Talk to your health care provider about which screenings and vaccines you need and how often you need them. This information is not intended to replace advice given to you by your health care provider. Make sure you discuss any questions you have with your health care provider. Document Released: 08/09/2015 Document Revised: 04/01/2016 Document Reviewed: 05/14/2015 Elsevier Interactive Patient Education  2017 [employer] Prevention in the Home Falls can cause injuries. They can happen to people of all ages. There are many things you can do to make your home safe and to help prevent falls. What can I do on the outside of my home? Regularly fix the edges of walkways and driveways and fix any cracks. Remove anything that might make you trip as you walk through a door, such as a raised step or threshold. Trim any bushes or trees on the path to your home. Use bright outdoor lighting. Clear any walking paths of anything that might make someone trip, such as rocks or tools. Regularly check to see if handrails are loose or broken. Make sure that both sides of any steps have handrails. Any raised decks and porches should have guardrails on the edges. Have any leaves, snow, or ice cleared regularly. Use sand or salt on walking paths during winter. Clean up any spills in your garage right away. This includes oil or grease spills. What can I do in the bathroom? Use night lights. Install grab bars by the toilet and in the tub and shower. Do not use towel bars as grab bars. Use non-skid mats or decals in the tub or shower. If you need to sit down in the shower, use a plastic, non-slip stool. Keep the floor dry. Clean up any water that spills on the floor as soon as it happens. Remove soap buildup in the tub or shower regularly. Attach bath mats  securely with double-sided non-slip rug tape. Do not have throw rugs and other things on the floor that can make you trip. What can I do in the bedroom? Use night lights. Make sure that you have a light by your bed that is easy to reach. Do not use any sheets or blankets that are too big for your bed. They should not hang down onto the floor. Have a firm chair that has side arms. You can use this for support while you get dressed. Do not have throw rugs and other things on the floor that can make you trip. What can I do in the kitchen? Clean up any spills right away. Avoid walking on wet floors. Keep items that you use a lot in easy-to-reach places. If you need to reach something above you, use a strong step stool that has a grab bar. Keep electrical cords out of the way. Do not use floor polish or wax that makes floors slippery. If you must use wax, use non-skid floor wax. Do not have throw rugs and other things on the floor that can make you trip. What can I do with my stairs? Do not leave any items on the stairs. Make sure that there are handrails on both sides of the stairs and use them. Fix handrails that are broken or loose. Make sure that handrails are as long as the stairways. Check any carpeting to make sure that it is firmly attached  to the stairs. Fix any carpet that is loose or worn. Avoid having throw rugs at the top or bottom of the stairs. If you do have throw rugs, attach them to the floor with carpet tape. Make sure that you have a light switch at the top of the stairs and the bottom of the stairs. If you do not have them, ask someone to add them for you. What else can I do to help prevent falls? Wear shoes that: Do not have high heels. Have rubber bottoms. Are comfortable and fit you well. Are closed at the toe. Do not wear sandals. If you use a stepladder: Make sure that it is fully opened. Do not climb a closed stepladder. Make sure that both sides of the stepladder  are locked into place. Ask someone to hold it for you, if possible. Clearly mark and make sure that you can see: Any grab bars or handrails. First and last steps. Where the edge of each step is. Use tools that help you move around (mobility aids) if they are needed. These include: Canes. Walkers. Scooters. Crutches. Turn on the lights when you go into a dark area. Replace any light bulbs as soon as they burn out. Set up your furniture so you have a clear path. Avoid moving your furniture around. If any of your floors are uneven, fix them. If there are any pets around you, be aware of where they are. Review your medicines with your doctor. Some medicines can make you feel dizzy. This can increase your chance of falling. Ask your doctor what other things that you can do to help prevent falls. This information is not intended to replace advice given to you by your health care provider. Make sure you discuss any questions you have with your health care provider. Document Released: 05/09/2009 Document Revised: 12/19/2015 Document Reviewed: 08/17/2014 Elsevier Interactive Patient Education  2017 [employer].

## 2022-04-20 ENCOUNTER — Telehealth: Payer: Self-pay

## 2022-04-20 NOTE — Telephone Encounter (Signed)
   Telephone encounter was:  Unsuccessful.  04/20/2022 Name: Tara Collins MRN: [id number] DOB: 04-30-1935  Unsuccessful outbound call made today to assist with:  Food Insecurity  Outreach Attempt:  2nd Attempt  A HIPAA compliant voice message was left requesting a return call.  Instructed patient to call back at [phone].  [place] Resource Care Guide   [email or web address]  ?? [phone]   Website: [email or web address]  [email or web address]  "We don't say no, we SHOW how!"         [facility]

## 2022-04-22 ENCOUNTER — Telehealth: Payer: Self-pay

## 2022-04-22 NOTE — Telephone Encounter (Signed)
   Telephone encounter was:  Unsuccessful.  04/22/2022 Name: Tara Collins MRN: [id number] DOB: [birth year]  Unsuccessful outbound call made today to assist with:  Food Insecurity  Outreach Attempt:  3rd Attempt.  Referral closed unable to contact patient.  A HIPAA compliant voice message was left requesting a return call.  Instructed patient to call back at [phone].  [place] Resource Care Guide   [email or web address]  ?? [phone]   Website: [email or web address]  [email or web address]  "We don't say no, we SHOW how!"         [facility]

## 2022-07-03 DIAGNOSIS — M1990 Unspecified osteoarthritis, unspecified site: Secondary | ICD-10-CM | POA: Diagnosis not present

## 2022-07-03 DIAGNOSIS — I10 Essential (primary) hypertension: Secondary | ICD-10-CM | POA: Diagnosis not present

## 2022-07-03 DIAGNOSIS — E785 Hyperlipidemia, unspecified: Secondary | ICD-10-CM | POA: Diagnosis not present

## 2022-07-29 ENCOUNTER — Other Ambulatory Visit: Payer: Self-pay | Admitting: Nurse Practitioner

## 2022-07-29 DIAGNOSIS — E876 Hypokalemia: Secondary | ICD-10-CM

## 2022-09-29 ENCOUNTER — Encounter: Payer: Self-pay | Admitting: Nurse Practitioner

## 2022-09-29 ENCOUNTER — Ambulatory Visit (INDEPENDENT_AMBULATORY_CARE_PROVIDER_SITE_OTHER): Payer: Medicare HMO | Admitting: Nurse Practitioner

## 2022-09-29 VITALS — BP 128/52 | HR 64 | Temp 98.2°F | Ht 60.0 in | Wt 149.0 lb

## 2022-09-29 DIAGNOSIS — J302 Other seasonal allergic rhinitis: Secondary | ICD-10-CM | POA: Diagnosis not present

## 2022-09-29 DIAGNOSIS — E876 Hypokalemia: Secondary | ICD-10-CM | POA: Diagnosis not present

## 2022-09-29 DIAGNOSIS — E785 Hyperlipidemia, unspecified: Secondary | ICD-10-CM | POA: Diagnosis not present

## 2022-09-29 DIAGNOSIS — I10 Essential (primary) hypertension: Secondary | ICD-10-CM

## 2022-09-29 DIAGNOSIS — Z2821 Immunization not carried out because of patient refusal: Secondary | ICD-10-CM

## 2022-09-29 MED ORDER — POTASSIUM CHLORIDE ER 10 MEQ PO CPCR: 10.0000 meq | ORAL_CAPSULE | Freq: Every day | ORAL | 1 refills | Status: DC

## 2022-09-29 MED ORDER — AMLODIPINE BESYLATE 10 MG PO TABS: 10.0000 mg | ORAL_TABLET | Freq: Every day | ORAL | 1 refills | Status: DC

## 2022-09-29 MED ORDER — CETIRIZINE HCL 10 MG PO TABS: 10.0000 mg | ORAL_TABLET | Freq: Every day | ORAL | 1 refills | Status: DC

## 2022-09-29 MED ORDER — LOVASTATIN 40 MG PO TABS: 40.0000 mg | ORAL_TABLET | Freq: Every day | ORAL | 1 refills | Status: DC

## 2022-09-29 MED ORDER — LOSARTAN POTASSIUM 100 MG PO TABS: 100.0000 mg | ORAL_TABLET | Freq: Every day | ORAL | 1 refills | Status: DC

## 2022-09-29 NOTE — Progress Notes (Signed)
I,Sheena H Holbrook,acting as a Education administrator for Minette Brine, FNP.,have documented all relevant documentation on the behalf of Minette Brine, FNP,as directed by  Minette Brine, FNP while in the presence of Minette Brine, [place].    Subjective:     Patient ID: Tara Collins , female    DOB: 1935/03/13 , 88 y.o.   MRN: [id number]   Chief Complaint  Patient presents with   Hyperlipidemia    HPI  Patient presents today for dyslipidemia follow up. Patient reports recent viral illness with nausea/emesis/diarrhea, about a week and half ago. 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 Patient reports she has improved.   Wt Readings from Last 3 Encounters: 09/29/22 : 149 lb (67.6 kg) 04/16/22 : 149 lb (67.6 kg) 04/08/22 : 149 lb 12.8 oz (67.9 kg)       Past Medical History:  Diagnosis Date   Arthritis    Hepatitis B 03/1963   Hypertension    PONV (postoperative nausea and vomiting)    Primary localized osteoarthritis of right knee 07/05/2018   Seasonal allergies      History reviewed. No pertinent family history.   Current Outpatient Medications:    diclofenac Sodium (VOLTAREN) 1 % GEL, Apply 2 g topically 4 (four) times daily., Disp: 100 g, Rfl: 1   amLODipine (NORVASC) 10 MG tablet, Take 1 tablet (10 mg total) by mouth daily., Disp: 90 tablet, Rfl: 1   cetirizine (ZYRTEC ALLERGY) 10 MG tablet, Take 1 tablet (10 mg total) by mouth daily., Disp: 90 tablet, Rfl: 1   losartan (COZAAR) 100 MG tablet, Take 1 tablet (100 mg total) by mouth daily., Disp: 90 tablet, Rfl: 1   lovastatin (MEVACOR) 40 MG tablet, Take 1 tablet (40 mg total) by mouth daily., Disp: 90 tablet, Rfl: 1   potassium chloride (MICRO-K) 10 MEQ CR capsule, Take 1 capsule (10 mEq total) by mouth daily., Disp: 90 capsule, Rfl: 1   Allergies  Allergen Reactions   Contrast Media [Iodinated Contrast Media] Shortness Of Breath    ALLERGY ADDED FROM QUESTIONING PT  SHE HAD A PREVIOUS REACTION TO  CONTRAST    Latex Rash    "skin burns"     Review of Systems  Constitutional: Negative.  Negative for activity change and fatigue.  Eyes:  Negative for visual disturbance.  Respiratory: Negative.  Negative for choking, shortness of breath and wheezing.   Cardiovascular: Negative.  Negative for chest pain, palpitations and leg swelling.  Gastrointestinal: Negative.   Endocrine: Negative.  Negative for polydipsia, polyphagia and polyuria.  Musculoskeletal: Negative.   Skin: Negative.   Neurological:  Negative for dizziness, weakness and headaches.  Psychiatric/Behavioral:  Negative for confusion. The patient is not nervous/anxious.   All other systems reviewed and are negative.    Today's Vitals   09/29/22 1109  BP: (!) 128/52  Pulse: 64  Temp: 98.2 F (36.8 C)  TempSrc: Oral  SpO2: 94%  Weight: 149 lb (67.6 kg)  Height: 5' (1.524 m)   Body mass index is 29.1 kg/m.   Objective:  Physical Exam Vitals reviewed.  Constitutional:      General: She is not in acute distress.    Appearance: Normal appearance. She is well-developed. She is obese.  Eyes:     Pupils: Pupils are equal, round, and reactive to light.  Cardiovascular:     Rate and Rhythm: Normal rate and regular rhythm.     Pulses: Normal pulses.  Heart sounds: Normal heart sounds. No murmur heard. Pulmonary:     Effort: Pulmonary effort is normal. No respiratory distress.     Breath sounds: Normal breath sounds. No wheezing.  Musculoskeletal:        General: Normal range of motion.  Skin:    General: Skin is warm and dry.     Capillary Refill: Capillary refill takes less than 2 seconds.  Neurological:     General: No focal deficit present.     Mental Status: She is alert and oriented to person, place, and time.     Cranial Nerves: No cranial nerve deficit.     Motor: No weakness.  Psychiatric:        Mood and Affect: Mood normal.         Assessment And Plan:     1. Essential  hypertension Comments: Blood pressure is controlled, continue current medications and follow up with Dr. Terrence Dupont - amLODipine (NORVASC) 10 MG tablet; Take 1 tablet (10 mg total) by mouth daily.  Dispense: 90 tablet; Refill: 1 - losartan (COZAAR) 100 MG tablet; Take 1 tablet (100 mg total) by mouth daily.  Dispense: 90 tablet; Refill: 1 - lovastatin (MEVACOR) 40 MG tablet; Take 1 tablet (40 mg total) by mouth daily.  Dispense: 90 tablet; Refill: 1 - CMP14+EGFR  2. Dyslipidemia Comments: Controlled, continue statin. Tolerating well.  - lovastatin (MEVACOR) 40 MG tablet; Take 1 tablet (40 mg total) by mouth daily.  Dispense: 90 tablet; Refill: 1  3. HYPOKALEMIA Comments: Will recheck potassium level, not currently taking - potassium chloride (MICRO-K) 10 MEQ CR capsule; Take 1 capsule (10 mEq total) by mouth daily.  Dispense: 90 capsule; Refill: 1 - CMP14+EGFR  4. Seasonal allergies - cetirizine (ZYRTEC ALLERGY) 10 MG tablet; Take 1 tablet (10 mg total) by mouth daily.  Dispense: 90 tablet; Refill: 1  5. Tetanus, diphtheria, and acellular pertussis (Tdap) vaccination declined  6. COVID-19 vaccination declined Declines covid 19 vaccine. Discussed risk of covid 33 and if she changes her mind about the vaccine to call the office. Education has been provided regarding the importance of this vaccine but patient still declined. Advised may receive this vaccine at local pharmacy or Health Dept.or vaccine clinic. Aware to provide a copy of the vaccination record if obtained from local pharmacy or Health Dept.  Encouraged to take multivitamin, vitamin d, vitamin c and zinc to increase immune system. Aware can call office if would like to have vaccine here at office. Verbalized acceptance and understanding.   7. Herpes zoster vaccination declined Declines shingrix, educated on disease process and is aware if he changes his mind to notify office     Patient was given opportunity to ask questions.  Patient verbalized understanding of the plan and was able to repeat key elements of the plan. All questions were answered to their satisfaction.  Minette Brine, FNP   I, Minette Brine, FNP, have reviewed all documentation for this visit. The documentation on 09/29/22 for the exam, diagnosis, procedures, and orders are all accurate and complete.   IF YOU HAVE BEEN REFERRED TO A SPECIALIST, IT MAY TAKE 1-2 WEEKS TO SCHEDULE/PROCESS THE REFERRAL. IF YOU HAVE NOT HEARD FROM US/SPECIALIST IN TWO WEEKS, PLEASE GIVE [place] A CALL AT [phone].   THE PATIENT IS ENCOURAGED TO PRACTICE SOCIAL DISTANCING DUE TO THE COVID-19 PANDEMIC.

## 2022-10-12 ENCOUNTER — Other Ambulatory Visit: Payer: Self-pay | Admitting: Nurse Practitioner

## 2022-10-12 DIAGNOSIS — E785 Hyperlipidemia, unspecified: Secondary | ICD-10-CM

## 2022-10-12 DIAGNOSIS — I10 Essential (primary) hypertension: Secondary | ICD-10-CM

## 2023-01-29 DIAGNOSIS — M150 Primary generalized (osteo)arthritis: Secondary | ICD-10-CM | POA: Diagnosis not present

## 2023-01-29 DIAGNOSIS — I10 Essential (primary) hypertension: Secondary | ICD-10-CM | POA: Diagnosis not present

## 2023-01-29 DIAGNOSIS — E782 Mixed hyperlipidemia: Secondary | ICD-10-CM | POA: Diagnosis not present

## 2023-02-02 ENCOUNTER — Ambulatory Visit (INDEPENDENT_AMBULATORY_CARE_PROVIDER_SITE_OTHER): Payer: Medicare HMO | Admitting: Nurse Practitioner

## 2023-02-02 ENCOUNTER — Encounter: Payer: Self-pay | Admitting: Nurse Practitioner

## 2023-02-02 VITALS — BP 136/60 | HR 64 | Temp 97.7°F | Ht 60.0 in | Wt 150.8 lb

## 2023-02-02 DIAGNOSIS — I10 Essential (primary) hypertension: Secondary | ICD-10-CM | POA: Diagnosis not present

## 2023-02-02 DIAGNOSIS — Z6829 Body mass index (BMI) 29.0-29.9, adult: Secondary | ICD-10-CM | POA: Diagnosis not present

## 2023-02-02 DIAGNOSIS — Z2821 Immunization not carried out because of patient refusal: Secondary | ICD-10-CM | POA: Insufficient documentation

## 2023-02-02 DIAGNOSIS — E7800 Pure hypercholesterolemia, unspecified: Secondary | ICD-10-CM

## 2023-02-02 MED ORDER — POTASSIUM CHLORIDE ER 10 MEQ PO CPCR: 10.0000 meq | ORAL_CAPSULE | Freq: Every day | ORAL | 1 refills | Status: DC

## 2023-02-02 NOTE — Assessment & Plan Note (Signed)
Cholesterol levels are stable, continue current regimen and f/u with Dr. Sharyn Lull. Cc'd Dr. Sharyn Lull for labs

## 2023-02-02 NOTE — Progress Notes (Signed)
Madelaine Bhat, CMA,acting as a Neurosurgeon for [name], FNP.,have documented all relevant documentation on the behalf of [name], FNP,as directed by  [name], FNP while in the presence of [name], FNP.  Subjective:  Patient ID: Tara Collins , female    DOB: Dec 08, 1934 , 88 y.o.   MRN: [id number]  Chief Complaint  Patient presents with   Hypertension    HPI  Patient presents today for an bp and chol follow up, patient reports compliance with  medications and has no other concerns today. Patient denies any chest pain, SOB, and headaches. She does work in a garden. 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   BP Readings from Last 3 Encounters: 02/02/23 : (!) 140/60 09/29/22 : (!) 128/52 04/16/22 : 132/64    Hypertension This is a chronic problem. The current episode started more than 1 year ago. The problem is unchanged. The problem is controlled. Pertinent negatives include no anxiety or chest pain. Risk factors for coronary artery disease include sedentary lifestyle and dyslipidemia. Past treatments include calcium channel blockers and angiotensin blockers. The current treatment provides significant improvement. There are no compliance problems.  There is no history of angina. There is no history of chronic renal disease.     Past Medical History:  Diagnosis Date   Arthritis    HEADACHE, TENSION 02/03/2008   Qualifier: Diagnosis of   By: Daphine Deutscher FNP, [place]       Hepatitis B 03/1963   Hypertension    LEG CRAMPS 02/03/2008   Qualifier: Diagnosis of   By: Daphine Deutscher FNP, [place]       PONV (postoperative nausea and vomiting)    Primary localized osteoarthritis of right knee 07/05/2018   Seasonal allergies      History reviewed. No pertinent family history.   Current Outpatient Medications:    amLODipine (NORVASC) 10 MG tablet, TAKE 1 TABLET EVERY DAY, Disp: 90 tablet, Rfl: 3   cetirizine (ZYRTEC ALLERGY) 10 MG tablet, Take 1 tablet (10 mg total) by  mouth daily., Disp: 90 tablet, Rfl: 1   diclofenac Sodium (VOLTAREN) 1 % GEL, Apply 2 g topically 4 (four) times daily., Disp: 100 g, Rfl: 1   losartan (COZAAR) 100 MG tablet, TAKE 1 TABLET EVERY DAY, Disp: 90 tablet, Rfl: 3   lovastatin (MEVACOR) 40 MG tablet, TAKE 1 TABLET EVERY DAY, Disp: 90 tablet, Rfl: 3   potassium chloride (MICRO-K) 10 MEQ CR capsule, Take 1 capsule (10 mEq total) by mouth daily., Disp: 90 capsule, Rfl: 1   Allergies  Allergen Reactions   Contrast Media [Iodinated Contrast Media] Shortness Of Breath    ALLERGY ADDED FROM QUESTIONING PT  SHE HAD A PREVIOUS REACTION TO CONTRAST    Latex Rash    "skin burns"     Review of Systems  Constitutional: Negative.   Respiratory: Negative.    Cardiovascular: Negative.  Negative for chest pain.  Neurological: Negative.   Psychiatric/Behavioral: Negative.       Today's Vitals   02/02/23 0840 02/02/23 0900  BP: (!) 140/60 136/60  Pulse: 64   Temp: 97.7 F (36.5 C)   TempSrc: Oral   Weight: 150 lb 12.8 oz (68.4 kg)   Height: 5' (1.524 m)   PainSc: 2    PainLoc: Knee    Body mass index is 29.45 kg/m.  Wt Readings from Last 3 Encounters:  02/02/23 150 lb 12.8 oz (68.4 kg)  09/29/22 149 lb (67.6 kg)  04/16/22 149 lb (  67.6 kg)     Objective:  Physical Exam Vitals reviewed.  Constitutional:      General: She is not in acute distress.    Appearance: Normal appearance.  Cardiovascular:     Pulses: Normal pulses.     Heart sounds: Normal heart sounds. No murmur heard. Pulmonary:     Effort: Pulmonary effort is normal. No respiratory distress.     Breath sounds: Normal breath sounds. No wheezing.  Skin:    General: Skin is warm and dry.     Capillary Refill: Capillary refill takes less than 2 seconds.  Neurological:     General: No focal deficit present.     Mental Status: She is alert and oriented to person, place, and time.     Cranial Nerves: No cranial nerve deficit.     Motor: No weakness.   Psychiatric:        Mood and Affect: Mood normal.        Behavior: Behavior normal.        Thought Content: Thought content normal.        Judgment: Judgment normal.         Assessment And Plan:  Essential hypertension Assessment & Plan: Blood pressure slightly elevated with repeat improved slightly. She will take her medications when she returns home. Continue f/u with Dr. Sharyn Lull  Orders: -     Potassium Chloride ER; Take 1 capsule (10 mEq total) by mouth daily.  Dispense: 90 capsule; Refill: 1 -     Hepatic function panel -     Basic metabolic panel  Pure hypercholesterolemia Assessment & Plan: Cholesterol levels are stable, continue current regimen and f/u with Dr. Sharyn Lull. Cc'd Dr. Sharyn Lull for labs  Orders: -     Lipid panel  COVID-19 vaccination declined Assessment & Plan: Declines covid 19 vaccine. Discussed risk of covid 55 and if she changes her mind about the vaccine to call the office. Education has been provided regarding the importance of this vaccine but patient still declined. Advised may receive this vaccine at local pharmacy or Health Dept.or vaccine clinic. Aware to provide a copy of the vaccination record if obtained from local pharmacy or Health Dept.  Encouraged to take multivitamin, vitamin d, vitamin c and zinc to increase immune system. Aware can call office if would like to have vaccine here at office. Verbalized acceptance and understanding.    Herpes zoster vaccination declined Assessment & Plan: Declines shingrix, educated on disease process and is aware if he changes his mind to notify office     Tetanus, diphtheria, and acellular pertussis (Tdap) vaccination declined  BMI 29.0-29.9,adult    Return for cancel October appt with me, change AWV to virtual; already has HM.   Patient was given opportunity to ask questions. Patient verbalized understanding of the plan and was able to repeat key elements of the plan. All questions were answered  to their satisfaction.    Jeanell Sparrow, FNP, have reviewed all documentation for this visit. The documentation on 02/02/23 for the exam, diagnosis, procedures, and orders are all accurate and complete.   IF YOU HAVE BEEN REFERRED TO A SPECIALIST, IT MAY TAKE 1-2 WEEKS TO SCHEDULE/PROCESS THE REFERRAL. IF YOU HAVE NOT HEARD FROM US/SPECIALIST IN TWO WEEKS, PLEASE GIVE [place] A CALL AT [phone] X [phone].

## 2023-02-02 NOTE — Assessment & Plan Note (Signed)
Declines covid 19 vaccine. Discussed risk of covid 19 and if she changes her mind about the vaccine to call the office. Education has been provided regarding the importance of this vaccine but patient still declined. Advised may receive this vaccine at local pharmacy or Health Dept.or vaccine clinic. Aware to provide a copy of the vaccination record if obtained from local pharmacy or Health Dept.  Encouraged to take multivitamin, vitamin d, vitamin c and zinc to increase immune system. Aware can call office if would like to have vaccine here at office. Verbalized acceptance and understanding.  

## 2023-02-02 NOTE — Assessment & Plan Note (Signed)
Declines shingrix, educated on disease process and is aware if he changes his mind to notify office  

## 2023-02-02 NOTE — Assessment & Plan Note (Signed)
Blood pressure slightly elevated with repeat improved slightly. She will take her medications when she returns home. Continue f/u with Dr. Sharyn Lull

## 2023-02-03 LAB — BASIC METABOLIC PANEL
BUN/Creatinine Ratio: 15 (ref 12–28)
BUN: 13 mg/dL (ref 8–27)
CO2: 23 mmol/L (ref 20–29)
Calcium: 9.6 mg/dL (ref 8.7–10.3)
Chloride: 103 mmol/L (ref 96–106)
Creatinine, Ser: 0.89 mg/dL (ref 0.57–1.00)
Glucose: 89 mg/dL (ref 70–99)
Potassium: 3.9 mmol/L (ref 3.5–5.2)
Sodium: 143 mmol/L (ref 134–144)
eGFR: 63 mL/min/{1.73_m2} (ref >59)

## 2023-02-03 LAB — HEPATIC FUNCTION PANEL
ALT: 19 IU/L (ref 0–32)
AST: 25 IU/L (ref 0–40)
Albumin: 4.2 g/dL (ref 3.7–4.7)
Alkaline Phosphatase: 77 IU/L (ref 44–121)
Bilirubin Total: 0.7 mg/dL (ref 0.0–1.2)
Bilirubin, Direct: 0.17 mg/dL (ref 0.00–0.40)
Total Protein: 6.8 g/dL (ref 6.0–8.5)

## 2023-02-03 LAB — LIPID PANEL
Chol/HDL Ratio: 2.3 ratio (ref 0.0–4.4)
Cholesterol, Total: 121 mg/dL (ref 100–199)
HDL: 53 mg/dL (ref >39)
LDL Chol Calc (NIH): 50 mg/dL (ref 0–99)
Triglycerides: 99 mg/dL (ref 0–149)
VLDL Cholesterol Cal: 18 mg/dL (ref 5–40)

## 2023-04-13 NOTE — Progress Notes (Unsigned)
Madelaine Bhat, CMA,acting as a Neurosurgeon for [name], FNP.,have documented all relevant documentation on the behalf of [name], FNP,as directed by  [name], FNP while in the presence of [name], FNP.  Subjective:    Patient ID: Tara Collins , female    DOB: [birth year] , 88 y.o.   MRN: [id number]  No chief complaint on file.   HPI  Patient presents today for HM, patient reports compliance with medications. Patient denies any chest pain, SOB, or headaches. Patient has no concerns today.       Past Medical History:  Diagnosis Date  . Arthritis   . HEADACHE, TENSION 02/03/2008   Qualifier: Diagnosis of   By: Daphine Deutscher FNP, Zena Amos      . Hepatitis B 03/1963  . Hypertension   . LEG CRAMPS 02/03/2008   Qualifier: Diagnosis of   By: Daphine Deutscher FNP, Zena Amos      . PONV (postoperative nausea and vomiting)   . Primary localized osteoarthritis of right knee 07/05/2018  . Seasonal allergies      No family history on file.   Current Outpatient Medications:  .  amLODipine (NORVASC) 10 MG tablet, TAKE 1 TABLET EVERY DAY, Disp: 90 tablet, Rfl: 3 .  cetirizine (ZYRTEC ALLERGY) 10 MG tablet, Take 1 tablet (10 mg total) by mouth daily., Disp: 90 tablet, Rfl: 1 .  diclofenac Sodium (VOLTAREN) 1 % GEL, Apply 2 g topically 4 (four) times daily., Disp: 100 g, Rfl: 1 .  losartan (COZAAR) 100 MG tablet, TAKE 1 TABLET EVERY DAY, Disp: 90 tablet, Rfl: 3 .  lovastatin (MEVACOR) 40 MG tablet, TAKE 1 TABLET EVERY DAY, Disp: 90 tablet, Rfl: 3 .  potassium chloride (MICRO-K) 10 MEQ CR capsule, Take 1 capsule (10 mEq total) by mouth daily., Disp: 90 capsule, Rfl: 1   Allergies  Allergen Reactions  . Contrast Media [Iodinated Contrast Media] Shortness Of Breath    ALLERGY ADDED FROM QUESTIONING PT  SHE HAD A PREVIOUS REACTION TO CONTRAST   . Latex Rash    "skin burns"      The patient states she uses {contraceptive methods:5051} for birth control. No LMP recorded. Patient has had a  hysterectomy.. {Dysmenorrhea-menorrhagia:21918}. Negative for: breast discharge, breast lump(s), breast pain and breast self exam. Associated symptoms include abnormal vaginal bleeding. Pertinent negatives include abnormal bleeding (hematology), anxiety, decreased libido, depression, difficulty falling sleep, dyspareunia, history of infertility, nocturia, sexual dysfunction, sleep disturbances, urinary incontinence, urinary urgency, vaginal discharge and vaginal itching. Diet regular.The patient states her exercise level is    . The patient's tobacco use is:  Social History   Tobacco Use  Smoking Status Never  Smokeless Tobacco Never  . She has been exposed to passive smoke. The patient's alcohol use is:  Social History   Substance and Sexual Activity  Alcohol Use Not Currently  . Additional information: Last pap ***, next one scheduled for ***.    Review of Systems  Constitutional: Negative.   HENT: Negative.    Eyes: Negative.   Respiratory: Negative.    Cardiovascular: Negative.   Gastrointestinal: Negative.   Endocrine: Negative.   Genitourinary: Negative.   Musculoskeletal: Negative.   Skin: Negative.   Allergic/Immunologic: Negative.   Neurological: Negative.   Hematological: Negative.   Psychiatric/Behavioral: Negative.      There were no vitals filed for this visit. There is no height or weight on file to calculate BMI.  Wt Readings from Last 3 Encounters:  02/02/23 150 lb 12.8 oz (  68.4 kg)  09/29/22 149 lb (67.6 kg)  04/16/22 149 lb (67.6 kg)     Objective:  Physical Exam      Assessment And Plan:     Encounter for annual health examination  Essential hypertension  Pure hypercholesterolemia     No follow-ups on file. Patient was given opportunity to ask questions. Patient verbalized understanding of the plan and was able to repeat key elements of the plan. All questions were answered to their satisfaction.   [name], FNP  I, [name], FNP,  have reviewed all documentation for this visit. The documentation on 04/13/23 for the exam, diagnosis, procedures, and orders are all accurate and complete.

## 2023-04-15 ENCOUNTER — Ambulatory Visit (INDEPENDENT_AMBULATORY_CARE_PROVIDER_SITE_OTHER): Payer: Medicare HMO | Admitting: Nurse Practitioner

## 2023-04-15 ENCOUNTER — Encounter: Payer: Self-pay | Admitting: Nurse Practitioner

## 2023-04-15 VITALS — BP 120/62 | HR 74 | Temp 98.4°F | Ht 60.0 in | Wt 150.6 lb

## 2023-04-15 DIAGNOSIS — I10 Essential (primary) hypertension: Secondary | ICD-10-CM

## 2023-04-15 DIAGNOSIS — Z0000 Encounter for general adult medical examination without abnormal findings: Secondary | ICD-10-CM

## 2023-04-15 DIAGNOSIS — Z2821 Immunization not carried out because of patient refusal: Secondary | ICD-10-CM

## 2023-04-15 DIAGNOSIS — Z79899 Other long term (current) drug therapy: Secondary | ICD-10-CM | POA: Diagnosis not present

## 2023-04-15 DIAGNOSIS — J302 Other seasonal allergic rhinitis: Secondary | ICD-10-CM | POA: Diagnosis not present

## 2023-04-15 DIAGNOSIS — E7800 Pure hypercholesterolemia, unspecified: Secondary | ICD-10-CM | POA: Insufficient documentation

## 2023-04-15 LAB — POCT URINALYSIS DIP (CLINITEK)
Bilirubin, UA: NEGATIVE
Blood, UA: NEGATIVE
Glucose, UA: NEGATIVE mg/dL
Ketones, POC UA: NEGATIVE mg/dL
Nitrite, UA: NEGATIVE
POC PROTEIN,UA: NEGATIVE
Spec Grav, UA: 1.015 (ref 1.010–1.025)
Urobilinogen, UA: 0.2 E.U./dL
pH, UA: 7 (ref 5.0–8.0)

## 2023-04-15 MED ORDER — LOVASTATIN 40 MG PO TABS
40.0000 mg | ORAL_TABLET | Freq: Every day | ORAL | 3 refills | Status: DC
Start: 2023-04-15 — End: 2023-09-29

## 2023-04-15 MED ORDER — LOSARTAN POTASSIUM 100 MG PO TABS
100.0000 mg | ORAL_TABLET | Freq: Every day | ORAL | 3 refills | Status: DC
Start: 2023-04-15 — End: 2023-09-29

## 2023-04-15 MED ORDER — CETIRIZINE HCL 10 MG PO TABS
10.0000 mg | ORAL_TABLET | Freq: Every day | ORAL | 1 refills | Status: DC
Start: 2023-04-15 — End: 2023-10-13

## 2023-04-15 MED ORDER — AMLODIPINE BESYLATE 10 MG PO TABS
10.0000 mg | ORAL_TABLET | Freq: Every day | ORAL | 3 refills | Status: DC
Start: 2023-04-15 — End: 2023-09-29

## 2023-04-15 NOTE — Assessment & Plan Note (Addendum)
Mostly controlled, refill sent to pharmacy

## 2023-04-15 NOTE — Patient Instructions (Signed)
Health Maintenance  Topic Date Due   Medicare Annual Wellness Visit  04/17/2023   COVID-19 Vaccine (4 - 2023-24 season) 05/01/2023*   Zoster (Shingles) Vaccine (1 of 2) 05/05/2023*   Flu Shot  10/25/2023*   Pneumonia Vaccine (2 of 2 - PCV) 04/14/2024*   DEXA scan (bone density measurement)  Completed   HPV Vaccine  Aged Out   DTaP/Tdap/Td vaccine  Discontinued  *Topic was postponed. The date shown is not the original due date.

## 2023-04-15 NOTE — Assessment & Plan Note (Signed)
Cholesterol levels are well controlled, continue current medications and focusing on low fat diet

## 2023-04-15 NOTE — Assessment & Plan Note (Addendum)
Blood pressure is well controlled, continue current medications. EKG done with nonspecific t abnormality HR 60

## 2023-04-15 NOTE — Assessment & Plan Note (Signed)
Behavior modifications discussed and diet history reviewed.   Pt will continue to exercise regularly and modify diet with low GI, plant based foods and decrease intake of processed foods.  Recommend intake of daily multivitamin, Vitamin D, and calcium.  Recommend mammogram for preventive screenings, as well as recommend immunizations that include influenza, TDAP, and Shingles (Declined vaccines including pneumonia)

## 2023-05-05 ENCOUNTER — Ambulatory Visit: Payer: Self-pay | Admitting: Nurse Practitioner

## 2023-05-05 ENCOUNTER — Ambulatory Visit: Payer: Medicare HMO

## 2023-05-05 VITALS — BP 128/60 | HR 71 | Temp 97.8°F | Ht 60.0 in | Wt 149.2 lb

## 2023-05-05 DIAGNOSIS — Z0000 Encounter for general adult medical examination without abnormal findings: Secondary | ICD-10-CM

## 2023-05-05 NOTE — Progress Notes (Signed)
Subjective:   Tara Collins is a 88 y.o. female who presents for Medicare Annual (Subsequent) preventive examination.  Visit Complete: In person    Cardiac Risk Factors include: advanced age (>6men, >64 women);dyslipidemia;hypertension     Objective:    Today's Vitals   05/05/23 0823  BP: 128/60  Pulse: 71  Temp: 97.8 F (36.6 C)  TempSrc: Oral  SpO2: 94%  Weight: 149 lb 3.2 oz (67.7 kg)  Height: 5' (1.524 m)   Body mass index is 29.14 kg/m.     05/05/2023    8:31 AM 04/16/2022    8:26 AM 03/27/2022    6:50 PM 04/03/2021   11:03 AM 03/21/2020   10:34 AM 03/16/2019   10:10 AM 07/26/2018    8:17 AM  Advanced Directives  Does Patient Have a Medical Advance Directive? No No No No No No No  Would patient like information on creating a medical advance directive? No - Patient declined No - Patient declined No - Patient declined Yes (MAU/Ambulatory/Procedural Areas - Information given)   No - Patient declined    Current Medications (verified) Outpatient Encounter Medications as of 05/05/2023  Medication Sig   amLODipine (NORVASC) 10 MG tablet Take 1 tablet (10 mg total) by mouth daily.   cetirizine (ZYRTEC ALLERGY) 10 MG tablet Take 1 tablet (10 mg total) by mouth daily.   diclofenac Sodium (VOLTAREN) 1 % GEL Apply 2 g topically 4 (four) times daily.   losartan (COZAAR) 100 MG tablet Take 1 tablet (100 mg total) by mouth daily.   lovastatin (MEVACOR) 40 MG tablet Take 1 tablet (40 mg total) by mouth daily.   potassium chloride (MICRO-K) 10 MEQ CR capsule Take 1 capsule (10 mEq total) by mouth daily.   No facility-administered encounter medications on file as of 05/05/2023.    Allergies (verified) Contrast media [iodinated contrast media] and Latex   History: Past Medical History:  Diagnosis Date   Arthritis    DEPRESSION/ANXIETY 11/29/2008   Annotation: PHQ-9 score 3, completed 11/29/2008  Qualifier: Diagnosis of   By: Daphine Deutscher FNP, [place]         HEADACHE, TENSION  02/03/2008   Qualifier: Diagnosis of   By: Daphine Deutscher FNP, [place]kedtra       Hepatitis B 03/1963   Hypertension    LEG CRAMPS 02/03/2008   Qualifier: Diagnosis of   By: Daphine Deutscher FNP, [place]kedtra       PONV (postoperative nausea and vomiting)    Primary localized osteoarthritis of right knee 07/05/2018   Seasonal allergies    Past Surgical History:  Procedure Laterality Date   ABDOMINAL HYSTERECTOMY  01/1991   APPENDECTOMY     CATARACT EXTRACTION, BILATERAL     CHOLECYSTECTOMY  10/2017   PARTIAL KNEE ARTHROPLASTY Right 07/05/2018   Procedure: UNICOMPARTMENTAL KNEE;  Surgeon: Teryl Jalei, MD;  Location: [facility];  Service: Orthopedics;  Laterality: Right;   REPLACEMENT TOTAL KNEE Left 2012   History reviewed. No pertinent family history. Social History   Socioeconomic History   Marital status: Single    Spouse name: Not on file   Number of children: Not on file   Years of education: Not on file   Highest education level: Not on file  Occupational History   Occupation: retired  Tobacco Use   Smoking status: Never   Smokeless tobacco: Never  Vaping Use   Vaping status: Never Used  Substance and Sexual Activity   Alcohol use: Not Currently   Drug use: Not Currently  Sexual activity: Not Currently  Other Topics Concern   Not on file  Social History Narrative   Not on file   Social Determinants of Health   Financial Resource Strain: Low Risk  (05/05/2023)   Overall Financial Resource Strain (CARDIA)    Difficulty of Paying Living Expenses: Not hard at all  Food Insecurity: No Food Insecurity (05/05/2023)   Hunger Vital Sign    Worried About Running Out of Food in the Last Year: Never true    Ran Out of Food in the Last Year: Never true  Transportation Needs: No Transportation Needs (05/05/2023)   PRAPARE - Administrator, Civil Service (Medical): No    Lack of Transportation (Non-Medical): No  Physical Activity: Inactive (05/05/2023)   Exercise Vital Sign    Days of  Exercise per Week: 0 days    Minutes of Exercise per Session: 0 min  Stress: No Stress Concern Present (05/05/2023)   [employer] of Occupational Health - Occupational Stress Questionnaire    Feeling of Stress : Not at all  Social Connections: Moderately Isolated (05/05/2023)   Social Connection and Isolation Panel [NHANES]    Frequency of Communication with Friends and Family: More than three times a week    Frequency of Social Gatherings with Friends and Family: More than three times a week    Attends Religious Services: More than 4 times per year    Active Member of [employer] or Organizations: No    Attends Engineer, structural: Never    Marital Status: Never married    Tobacco Counseling Counseling given: Not Answered   Clinical Intake:  Pre-visit preparation completed: Yes  Pain : No/denies pain     Nutritional Status: BMI 25 -29 Overweight Nutritional Risks: None Diabetes: No  How often do you need to have someone help you when you read instructions, pamphlets, or other written materials from your doctor or pharmacy?: 1 - Never  Interpreter Needed?: No  Information entered by :: NAllen LPN   Activities of Daily Living    05/05/2023    8:25 AM  In your present state of health, do you have any difficulty performing the following activities:  Hearing? 0  Vision? 0  Difficulty concentrating or making decisions? 0  Walking or climbing stairs? 0  Dressing or bathing? 0  Doing errands, shopping? 0  Preparing Food and eating ? N  Using the Toilet? N  In the past six months, have you accidently leaked urine? N  Do you have problems with loss of bowel control? N  Managing your Medications? N  Managing your Finances? N  Housekeeping or managing your Housekeeping? N    Patient Care Team: [name], FNP as PCP - General (General Practice)  Indicate any recent Medical Services you may have received from other than [facility] providers in the past year (date  may be approximate).     Assessment:   This is a routine wellness examination for Tara Collins.  Hearing/Vision screen Hearing Screening - Comments:: Denies hearing issues Vision Screening - Comments:: No regular eye exams, [facility]   Goals Addressed             This Visit's Progress    Patient Stated       05/05/2023, wants to start exercise at gym       Depression Screen    05/05/2023    8:33 AM 04/15/2023    8:37 AM 02/02/2023    8:39 AM 09/29/2022   11:07  AM 04/16/2022    8:27 AM 04/03/2021   11:06 AM 03/21/2020   10:36 AM  PHQ 2/9 Scores  PHQ - 2 Score 0 0 0 0 0 0 0  PHQ- 9 Score 0 0 0        Fall Risk    05/05/2023    8:32 AM 04/15/2023    8:37 AM 02/02/2023    8:39 AM 09/29/2022   11:07 AM 04/16/2022    8:27 AM  Fall Risk   Falls in the past year? 0  0 0 0  Number falls in past yr: 0 0 0  0  Injury with Fall? 0 0 0  0  Risk for fall due to : Medication side effect;Impaired mobility;Impaired balance/gait No Fall Risks No Fall Risks  Medication side effect  Follow up Falls prevention discussed;Falls evaluation completed Falls evaluation completed Falls evaluation completed  Falls prevention discussed;Education provided;Falls evaluation completed    MEDICARE RISK AT HOME: Medicare Risk at Home Any stairs in or around the home?: No If so, are there any without handrails?: No Home free of loose throw rugs in walkways, pet beds, electrical cords, etc?: Yes Adequate lighting in your home to reduce risk of falls?: Yes Life alert?: No Use of a cane, walker or w/c?: Yes Grab bars in the bathroom?: No Shower chair or bench in shower?: No Elevated toilet seat or a handicapped toilet?: Yes  TIMED UP AND GO:  Was the test performed?  Yes  Length of time to ambulate 10 feet: 6 sec Gait slow and steady with assistive device    Cognitive Function:        05/05/2023    8:36 AM 04/16/2022    8:28 AM 04/03/2021   11:08 AM 03/21/2020   10:38 AM 03/16/2019   10:13 AM  6CIT Screen   What Year? 0 points 0 points 0 points 0 points 0 points  What month? 0 points 0 points 0 points 0 points 0 points  What time? 0 points 3 points 0 points 0 points 0 points  Count back from 20 0 points 0 points 0 points 0 points 0 points  Months in reverse 0 points 0 points 0 points 2 points 0 points  Repeat phrase 6 points 4 points 10 points 6 points 0 points  Total Score 6 points 7 points 10 points 8 points 0 points    Immunizations Immunization History  Administered Date(s) Administered   Influenza Whole 07/27/2006   PFIZER(Purple Top)SARS-COV-2 Vaccination 09/09/2019, 10/04/2019, 06/15/2020   Pneumococcal Polysaccharide-23 11/29/2008    TDAP status: Due, Education has been provided regarding the importance of this vaccine. Advised may receive this vaccine at local pharmacy or Health Dept. Aware to provide a copy of the vaccination record if obtained from local pharmacy or Health Dept. Verbalized acceptance and understanding.  Flu Vaccine status: Declined, Education has been provided regarding the importance of this vaccine but patient still declined. Advised may receive this vaccine at local pharmacy or Health Dept. Aware to provide a copy of the vaccination record if obtained from local pharmacy or Health Dept. Verbalized acceptance and understanding.  Pneumococcal vaccine status: Declined,  Education has been provided regarding the importance of this vaccine but patient still declined. Advised may receive this vaccine at local pharmacy or Health Dept. Aware to provide a copy of the vaccination record if obtained from local pharmacy or Health Dept. Verbalized acceptance and understanding.   Covid-19 vaccine status: Information provided on how to obtain vaccines.  Qualifies for Shingles Vaccine? Yes   Zostavax completed No   Shingrix Completed?: No.    Education has been provided regarding the importance of this vaccine. Patient has been advised to call insurance company to determine  out of pocket expense if they have not yet received this vaccine. Advised may also receive vaccine at local pharmacy or Health Dept. Verbalized acceptance and understanding.  Screening Tests Health Maintenance  Topic Date Due   Zoster Vaccines- Shingrix (1 of 2) 05/05/2023 (Originally 01/12/1954)   COVID-19 Vaccine (4 - 2023-24 season) 05/21/2023 (Originally 03/28/2023)   INFLUENZA VACCINE  10/25/2023 (Originally 02/25/2023)   Pneumonia Vaccine 33+ Years old (2 of 2 - PCV) 04/14/2024 (Originally 11/29/2009)   Medicare Annual Wellness (AWV)  05/04/2024   DEXA SCAN  Completed   HPV VACCINES  Aged Out   DTaP/Tdap/Td  Discontinued    Health Maintenance  There are no preventive care reminders to display for this patient.   Colorectal cancer screening: No longer required.   Mammogram status: No longer required due to age.  Bone Density status: Completed 12/07/2008.   Lung Cancer Screening: (Low Dose CT Chest recommended if Age 84-80 years, 20 pack-year currently smoking OR have quit w/in 15years.) does not qualify.   Lung Cancer Screening Referral: no  Additional Screening:  Hepatitis C Screening: does not qualify;   Vision Screening: Recommended annual ophthalmology exams for early detection of glaucoma and other disorders of the eye. Is the patient up to date with their annual eye exam?  Yes  Who is the provider or what is the name of the office in which the patient attends annual eye exams? [facility] If pt is not established with a provider, would they like to be referred to a provider to establish care? No .   Dental Screening: Recommended annual dental exams for proper oral hygiene  Diabetic Foot Exam: n/a  Community Resource Referral / Chronic Care Management: CRR required this visit?  No   CCM required this visit?  No     Plan:     I have personally reviewed and noted the following in the patient's chart:   Medical and social history Use of alcohol, tobacco or illicit  drugs  Current medications and supplements including opioid prescriptions. Patient is not currently taking opioid prescriptions. Functional ability and status Nutritional status Physical activity Advanced directives List of other physicians Hospitalizations, surgeries, and ER visits in previous 12 months Vitals Screenings to include cognitive, depression, and falls Referrals and appointments  In addition, I have reviewed and discussed with patient certain preventive protocols, quality metrics, and best practice recommendations. A written personalized care plan for preventive services as well as general preventive health recommendations were provided to patient.     Barb Merino, LPN   29/11/6211   After Visit Summary: (In Person-Printed) AVS printed and given to the patient  Nurse Notes: none

## 2023-05-05 NOTE — Patient Instructions (Addendum)
[name] , Thank you for taking time to come for your Medicare Wellness Visit. I appreciate your ongoing commitment to your health goals. Please review the following plan we discussed and let me know if I can assist you in the future.   Referrals/Orders/Follow-Ups/Clinician Recommendations: none  This is a list of the screening recommended for you and due dates:  Health Maintenance  Topic Date Due   Zoster (Shingles) Vaccine (1 of 2) 05/05/2023*   COVID-19 Vaccine (4 - 2023-24 season) 05/21/2023*   Flu Shot  10/25/2023*   Pneumonia Vaccine (2 of 2 - PCV) 04/14/2024*   Medicare Annual Wellness Visit  05/04/2024   DEXA scan (bone density measurement)  Completed   HPV Vaccine  Aged Out   DTaP/Tdap/Td vaccine  Discontinued  *Topic was postponed. The date shown is not the original due date.    Advanced directives: (Declined) Advance directive discussed with you today. Even though you declined this today, please call our office should you change your mind, and we can give you the proper paperwork for you to fill out.  Next Medicare Annual Wellness Visit scheduled for next year: No, office will schedule appointment  Insert Preventive Care attachment Insert FALL PREVENTION attachment if needed

## 2023-07-22 ENCOUNTER — Other Ambulatory Visit: Payer: Self-pay | Admitting: Nurse Practitioner

## 2023-07-22 DIAGNOSIS — I10 Essential (primary) hypertension: Secondary | ICD-10-CM

## 2023-07-30 DIAGNOSIS — E782 Mixed hyperlipidemia: Secondary | ICD-10-CM | POA: Diagnosis not present

## 2023-07-30 DIAGNOSIS — I10 Essential (primary) hypertension: Secondary | ICD-10-CM | POA: Diagnosis not present

## 2023-07-30 DIAGNOSIS — M150 Primary generalized (osteo)arthritis: Secondary | ICD-10-CM | POA: Diagnosis not present

## 2023-08-04 DIAGNOSIS — E782 Mixed hyperlipidemia: Secondary | ICD-10-CM | POA: Diagnosis not present

## 2023-08-04 DIAGNOSIS — M150 Primary generalized (osteo)arthritis: Secondary | ICD-10-CM | POA: Diagnosis not present

## 2023-08-04 DIAGNOSIS — I10 Essential (primary) hypertension: Secondary | ICD-10-CM | POA: Diagnosis not present

## 2023-09-29 ENCOUNTER — Other Ambulatory Visit: Payer: Self-pay | Admitting: Nurse Practitioner

## 2023-09-29 DIAGNOSIS — E7800 Pure hypercholesterolemia, unspecified: Secondary | ICD-10-CM

## 2023-09-29 DIAGNOSIS — I10 Essential (primary) hypertension: Secondary | ICD-10-CM

## 2023-10-13 ENCOUNTER — Ambulatory Visit (INDEPENDENT_AMBULATORY_CARE_PROVIDER_SITE_OTHER): Payer: Medicare HMO | Admitting: Nurse Practitioner

## 2023-10-13 ENCOUNTER — Encounter: Payer: Self-pay | Admitting: Nurse Practitioner

## 2023-10-13 VITALS — BP 138/68 | HR 67 | Temp 98.3°F | Ht 60.0 in | Wt 152.6 lb

## 2023-10-13 DIAGNOSIS — J302 Other seasonal allergic rhinitis: Secondary | ICD-10-CM | POA: Diagnosis not present

## 2023-10-13 DIAGNOSIS — I10 Essential (primary) hypertension: Secondary | ICD-10-CM

## 2023-10-13 DIAGNOSIS — E7800 Pure hypercholesterolemia, unspecified: Secondary | ICD-10-CM

## 2023-10-13 DIAGNOSIS — Z79899 Other long term (current) drug therapy: Secondary | ICD-10-CM

## 2023-10-13 LAB — CMP14+EGFR
ALT: 14 IU/L (ref 0–32)
AST: 23 IU/L (ref 0–40)
Albumin: 4.1 g/dL (ref 3.7–4.7)
Alkaline Phosphatase: 87 IU/L (ref 44–121)
BUN/Creatinine Ratio: 11 — ABNORMAL LOW (ref 12–28)
BUN: 11 mg/dL (ref 8–27)
Bilirubin Total: 0.7 mg/dL (ref 0.0–1.2)
CO2: 23 mmol/L (ref 20–29)
Calcium: 9.4 mg/dL (ref 8.7–10.3)
Chloride: 105 mmol/L (ref 96–106)
Creatinine, Ser: 0.98 mg/dL (ref 0.57–1.00)
Globulin, Total: 2.5 g/dL (ref 1.5–4.5)
Glucose: 86 mg/dL (ref 70–99)
Potassium: 4 mmol/L (ref 3.5–5.2)
Sodium: 142 mmol/L (ref 134–144)
Total Protein: 6.6 g/dL (ref 6.0–8.5)
eGFR: 56 mL/min/{1.73_m2} — ABNORMAL LOW (ref >59)

## 2023-10-13 LAB — CBC
Hematocrit: 39.8 % (ref 34.0–46.6)
Hemoglobin: 13.5 g/dL (ref 11.1–15.9)
MCH: 33.8 pg — ABNORMAL HIGH (ref 26.6–33.0)
MCHC: 33.9 g/dL (ref 31.5–35.7)
MCV: 100 fL — ABNORMAL HIGH (ref 79–97)
Platelets: 242 10*3/uL (ref 150–450)
RBC: 4 x10E6/uL (ref 3.77–5.28)
RDW: 12 % (ref 11.7–15.4)
WBC: 4.6 10*3/uL (ref 3.4–10.8)

## 2023-10-13 LAB — LIPID PANEL
Chol/HDL Ratio: 2.6 ratio (ref 0.0–4.4)
Cholesterol, Total: 131 mg/dL (ref 100–199)
HDL: 51 mg/dL (ref >39)
LDL Chol Calc (NIH): 65 mg/dL (ref 0–99)
Triglycerides: 74 mg/dL (ref 0–149)
VLDL Cholesterol Cal: 15 mg/dL (ref 5–40)

## 2023-10-13 MED ORDER — CETIRIZINE HCL 10 MG PO TABS: 10.0000 mg | ORAL_TABLET | Freq: Every day | ORAL | 1 refills | Status: AC

## 2023-10-13 NOTE — Assessment & Plan Note (Addendum)
 Blood pressure is well controlled. Encouraged to limit stress as possible.  Continue current medications.

## 2023-10-13 NOTE — Patient Instructions (Signed)
 Hypertension, Adult Hypertension is another name for high blood pressure. High blood pressure forces your heart to work harder to pump blood. This can cause problems over time. There are two numbers in a blood pressure reading. There is a top number (systolic) over a bottom number (diastolic). It is best to have a blood pressure that is below 120/80. What are the causes? The cause of this condition is not known. Some other conditions can lead to high blood pressure. What increases the risk? Some lifestyle factors can make you more likely to develop high blood pressure: Smoking. Not getting enough exercise or physical activity. Being overweight. Having too much fat, sugar, calories, or salt (sodium) in your diet. Drinking too much alcohol. Other risk factors include: Having any of these conditions: Heart disease. Diabetes. High cholesterol. Kidney disease. Obstructive sleep apnea. Having a family history of high blood pressure and high cholesterol. Age. The risk increases with age. Stress. What are the signs or symptoms? High blood pressure may not cause symptoms. Very high blood pressure (hypertensive crisis) may cause: Headache. Fast or uneven heartbeats (palpitations). Shortness of breath. Nosebleed. Vomiting or feeling like you may vomit (nauseous). Changes in how you see. Very bad chest pain. Feeling dizzy. Seizures. How is this treated? This condition is treated by making healthy lifestyle changes, such as: Eating healthy foods. Exercising more. Drinking less alcohol. Your doctor may prescribe medicine if lifestyle changes do not help enough and if: Your top number is above 130. Your bottom number is above 80. Your personal target blood pressure may vary. Follow these instructions at home: Eating and drinking  If told, follow the DASH eating plan. To follow this plan: Fill one half of your plate at each meal with fruits and vegetables. Fill one fourth of your plate  at each meal with whole grains. Whole grains include whole-wheat pasta, brown rice, and whole-grain bread. Eat or drink low-fat dairy products, such as skim milk or low-fat yogurt. Fill one fourth of your plate at each meal with low-fat (lean) proteins. Low-fat proteins include fish, chicken without skin, eggs, beans, and tofu. Avoid fatty meat, cured and processed meat, or chicken with skin. Avoid pre-made or processed food. Limit the amount of salt in your diet to less than 1,500 mg each day. Do not drink alcohol if: Your doctor tells you not to drink. You are pregnant, may be pregnant, or are planning to become pregnant. If you drink alcohol: Limit how much you have to: 0-1 drink a day for women. 0-2 drinks a day for men. Know how much alcohol is in your drink. In the [place] one drink equals one 12 oz bottle of beer (355 mL), one 5 oz glass of wine (148 mL), or one 1 oz glass of hard liquor (44 mL). Lifestyle  Work with your doctor to stay at a healthy weight or to lose weight. Ask your doctor what the best weight is for you. Get at least 30 minutes of exercise that causes your heart to beat faster (aerobic exercise) most days of the week. This may include walking, swimming, or biking. Get at least 30 minutes of exercise that strengthens your muscles (resistance exercise) at least 3 days a week. This may include lifting weights or doing Pilates. Do not smoke or use any products that contain nicotine or tobacco. If you need help quitting, ask your doctor. Check your blood pressure at home as told by your doctor. Keep all follow-up visits. Medicines Take over-the-counter and prescription medicines  only as told by your doctor. Follow directions carefully. Do not skip doses of blood pressure medicine. The medicine does not work as well if you skip doses. Skipping doses also puts you at risk for problems. Ask your doctor about side effects or reactions to medicines that you should watch  for. Contact a doctor if: You think you are having a reaction to the medicine you are taking. You have headaches that keep coming back. You feel dizzy. You have swelling in your ankles. You have trouble with your vision. Get help right away if: You get a very bad headache. You start to feel mixed up (confused). You feel weak or numb. You feel faint. You have very bad pain in your: Chest. Belly (abdomen). You vomit more than once. You have trouble breathing. These symptoms may be an emergency. Get help right away. Call 911. Do not wait to see if the symptoms will go away. Do not drive yourself to the hospital. Summary Hypertension is another name for high blood pressure. High blood pressure forces your heart to work harder to pump blood. For most people, a normal blood pressure is less than 120/80. Making healthy choices can help lower blood pressure. If your blood pressure does not get lower with healthy choices, you may need to take medicine. This information is not intended to replace advice given to you by your health care provider. Make sure you discuss any questions you have with your health care provider. Document Revised: 05/01/2021 Document Reviewed: 05/01/2021 Elsevier Patient Education  2024 [employer].

## 2023-10-13 NOTE — Assessment & Plan Note (Signed)
Mostly controlled, refill sent to pharmacy

## 2023-10-13 NOTE — Assessment & Plan Note (Signed)
Cholesterol levels are well controlled, continue current medications and focusing on low fat diet

## 2023-10-13 NOTE — Progress Notes (Signed)
 I,Tara Collins, CMA,acting as a Neurosurgeon for Tara Felts, FNP.,have documented all relevant documentation on the behalf of Tara Felts, FNP,as directed by  Tara Felts, FNP while in the presence of Tara Felts, FNP.  Subjective:  Patient ID: Tara Collins , female    DOB: [birth year] , 89 y.o.   MRN: [id number]  Chief Complaint  Patient presents with   Hypertension   Hyperlipidemia    HPI  Patient presents today for an bp and chol follow up, patient reports compliance with  medications and has no other concerns today. Patient denies any chest pain, SOB, and headaches.  She has been under more stress with her family members coming from [place] and noticed her BP had been elevated.    Hypertension This is a chronic problem. The current episode started more than 1 year ago. The problem is unchanged. The problem is controlled. Pertinent negatives include no anxiety or chest pain. Risk factors for coronary artery disease include sedentary lifestyle, dyslipidemia and stress. Past treatments include calcium channel blockers and angiotensin blockers. The current treatment provides significant improvement. There are no compliance problems.  There is no history of angina. There is no history of chronic renal disease.  Hyperlipidemia This is a chronic problem. The current episode started more than 1 year ago. The problem is controlled. She has no history of chronic renal disease. Pertinent negatives include no chest pain. Current antihyperlipidemic treatment includes diet change and exercise. There are no compliance problems.  Risk factors for coronary artery disease include hypertension, a sedentary lifestyle and stress.     Past Medical History:  Diagnosis Date   Arthritis    DEPRESSION/ANXIETY 11/29/2008   Annotation: PHQ-9 score 3, completed 11/29/2008  Qualifier: Diagnosis of   By: Daphine Deutscher FNP, Nykedtra         HEADACHE, TENSION 02/03/2008   Qualifier: Diagnosis of   By: Daphine Deutscher FNP,  Nykedtra       Hepatitis B 03/1963   Hypertension    LEG CRAMPS 02/03/2008   Qualifier: Diagnosis of   By: Daphine Deutscher FNP, Nykedtra       PONV (postoperative nausea and vomiting)    Primary localized osteoarthritis of right knee 07/05/2018   Seasonal allergies      History reviewed. No pertinent family history.   Current Outpatient Medications:    amLODipine (NORVASC) 10 MG tablet, TAKE 1 TABLET EVERY DAY, Disp: 90 tablet, Rfl: 3   diclofenac Sodium (VOLTAREN) 1 % GEL, Apply 2 g topically 4 (four) times daily., Disp: 100 g, Rfl: 1   losartan (COZAAR) 100 MG tablet, TAKE 1 TABLET EVERY DAY, Disp: 90 tablet, Rfl: 3   lovastatin (MEVACOR) 40 MG tablet, TAKE 1 TABLET EVERY DAY, Disp: 90 tablet, Rfl: 3   potassium chloride (MICRO-K) 10 MEQ CR capsule, TAKE 1 CAPSULE EVERY DAY, Disp: 90 capsule, Rfl: 3   cetirizine (ZYRTEC ALLERGY) 10 MG tablet, Take 1 tablet (10 mg total) by mouth daily., Disp: 90 tablet, Rfl: 1   Allergies  Allergen Reactions   Contrast Media [Iodinated Contrast Media] Shortness Of Breath    ALLERGY ADDED FROM QUESTIONING PT  SHE HAD A PREVIOUS REACTION TO CONTRAST    Latex Rash    "skin burns"     Review of Systems  Constitutional: Negative.   Respiratory: Negative.    Cardiovascular: Negative.  Negative for chest pain.  Neurological: Negative.   Psychiatric/Behavioral: Negative.       Today's Vitals   10/13/23 0827 10/13/23 4098  BP: (!) 140/72 138/68  Pulse: 67   Temp: 98.3 F (36.8 C)   SpO2: 98%   Weight: 152 lb 9.6 oz (69.2 kg)   Height: 5' (1.524 m)    Body mass index is 29.8 kg/m.  Wt Readings from Last 3 Encounters:  10/13/23 152 lb 9.6 oz (69.2 kg)  05/05/23 149 lb 3.2 oz (67.7 kg)  04/15/23 150 lb 9.6 oz (68.3 kg)     Objective:  Physical Exam Vitals and nursing note reviewed.  Constitutional:      General: She is not in acute distress.    Appearance: Normal appearance.  Cardiovascular:     Rate and Rhythm: Normal rate and regular  rhythm.     Pulses: Normal pulses.     Heart sounds: Normal heart sounds. No murmur heard. Pulmonary:     Effort: Pulmonary effort is normal. No respiratory distress.     Breath sounds: Normal breath sounds. No wheezing.  Skin:    General: Skin is warm and dry.     Capillary Refill: Capillary refill takes less than 2 seconds.  Neurological:     General: No focal deficit present.     Mental Status: She is alert and oriented to person, place, and time.     Cranial Nerves: No cranial nerve deficit.     Motor: No weakness.  Psychiatric:        Mood and Affect: Mood normal.        Behavior: Behavior normal.        Thought Content: Thought content normal.        Judgment: Judgment normal.         Assessment And Plan:  Essential hypertension Assessment & Plan: Blood pressure is well controlled. Encouraged to limit stress as possible.  Continue current medications.   Orders: -     [id number]+EGFR  Pure hypercholesterolemia Assessment & Plan: Cholesterol levels are well controlled, continue current medications and focusing on low fat diet  Orders: -     Lipid panel  Other long term (current) drug therapy -     CBC  Seasonal allergies Assessment & Plan: Mostly controlled, refill sent to pharmacy  Orders: -     Cetirizine HCl; Take 1 tablet (10 mg total) by mouth daily.  Dispense: 90 tablet; Refill: 1     Return for keep sept apt. .  Patient was given opportunity to ask questions. Patient verbalized understanding of the plan and was able to repeat key elements of the plan. All questions were answered to their satisfaction.  Tara Felts, FNP  I, Tara Felts, FNP, have reviewed all documentation for this visit. The documentation on 10/13/23 for the exam, diagnosis, procedures, and orders are all accurate and complete.   IF YOU HAVE BEEN REFERRED TO A SPECIALIST, IT MAY TAKE 1-2 WEEKS TO SCHEDULE/PROCESS THE REFERRAL. IF YOU HAVE NOT HEARD FROM US/SPECIALIST IN TWO WEEKS,  PLEASE GIVE [place] A CALL AT [phone] X [phone].   THE PATIENT IS ENCOURAGED TO PRACTICE SOCIAL DISTANCING DUE TO THE COVID-19 PANDEMIC.

## 2023-11-03 DIAGNOSIS — M150 Primary generalized (osteo)arthritis: Secondary | ICD-10-CM | POA: Diagnosis not present

## 2023-11-03 DIAGNOSIS — E782 Mixed hyperlipidemia: Secondary | ICD-10-CM | POA: Diagnosis not present

## 2023-11-03 DIAGNOSIS — I10 Essential (primary) hypertension: Secondary | ICD-10-CM | POA: Diagnosis not present

## 2023-12-06 DIAGNOSIS — I10 Essential (primary) hypertension: Secondary | ICD-10-CM | POA: Diagnosis not present

## 2023-12-06 DIAGNOSIS — E782 Mixed hyperlipidemia: Secondary | ICD-10-CM | POA: Diagnosis not present

## 2023-12-06 DIAGNOSIS — M150 Primary generalized (osteo)arthritis: Secondary | ICD-10-CM | POA: Diagnosis not present

## 2024-03-13 ENCOUNTER — Other Ambulatory Visit: Payer: Self-pay

## 2024-03-13 ENCOUNTER — Ambulatory Visit (HOSPITAL_COMMUNITY)
Admission: EM | Admit: 2024-03-13 | Discharge: 2024-03-13 | Disposition: A | Discharge status: Home / Self Care | Attending: Emergency Medicine | Admitting: Emergency Medicine

## 2024-03-13 ENCOUNTER — Ambulatory Visit (INDEPENDENT_AMBULATORY_CARE_PROVIDER_SITE_OTHER)

## 2024-03-13 ENCOUNTER — Encounter (HOSPITAL_COMMUNITY): Payer: Self-pay | Admitting: Emergency Medicine

## 2024-03-13 DIAGNOSIS — M25532 Pain in left wrist: Secondary | ICD-10-CM

## 2024-03-13 DIAGNOSIS — M19042 Primary osteoarthritis, left hand: Secondary | ICD-10-CM | POA: Diagnosis not present

## 2024-03-13 DIAGNOSIS — M1812 Unilateral primary osteoarthritis of first carpometacarpal joint, left hand: Secondary | ICD-10-CM | POA: Diagnosis not present

## 2024-03-13 DIAGNOSIS — Q740 Other congenital malformations of upper limb(s), including shoulder girdle: Secondary | ICD-10-CM | POA: Diagnosis not present

## 2024-03-13 MED ORDER — PREDNISONE 20 MG PO TABS
ORAL_TABLET | ORAL | Status: AC
  Filled 2024-03-13: qty 1

## 2024-03-13 MED ORDER — PREDNISONE 20 MG PO TABS
20.0000 mg | ORAL_TABLET | Freq: Once | ORAL | Status: AC
  Administered 2024-03-13: 20 mg via ORAL

## 2024-03-13 MED ORDER — PREDNISONE 20 MG PO TABS: 30.0000 mg | ORAL_TABLET | Freq: Once | ORAL | Status: DC

## 2024-03-13 MED ORDER — PREDNISONE 10 MG PO TABS
ORAL_TABLET | ORAL | Status: AC
  Filled 2024-03-13: qty 1

## 2024-03-13 MED ORDER — PREDNISONE 10 MG PO TABS: 30.0000 mg | ORAL_TABLET | Freq: Every day | ORAL | 0 refills | Status: AC

## 2024-03-13 MED ORDER — PREDNISONE 10 MG PO TABS
10.0000 mg | ORAL_TABLET | Freq: Once | ORAL | Status: AC
  Administered 2024-03-13: 10 mg via ORAL

## 2024-03-13 NOTE — ED Triage Notes (Signed)
 Pain in left wrist, base of thumb.  Patient reports a history of arthritis.  Patient reports taking aspirin  for pain.  Patient has used an ointment on this area.  Pain started Friday, significantly worse on sunday

## 2024-03-13 NOTE — Discharge Instructions (Addendum)
 You will need to call your orthopedic clinic to make an appointment for follow up. Call today.  The prednisone  tablets given today will be your first dose. Starting tomorrow morning, you will continue with your second dose. Continue daily for a total of 5 doses.  Use the wrist brace for support.

## 2024-03-13 NOTE — ED Provider Notes (Signed)
 [facility]    CSN: [id number] Arrival date & time: 03/13/24  1027      History   Chief Complaint Chief Complaint  Patient presents with   Hand Pain    HPI Tara Collins is a 89 y.o. female.  2-3 days of left wrist and thumb pain. Reporting 10/10 pain.  Denies any injury, trauma, falls Not having numbness or tingling History of arthritis. Using aspirin  and topical Voltaren  ointment  Past Medical History:  Diagnosis Date   Arthritis    DEPRESSION/ANXIETY 11/29/2008   Annotation: PHQ-9 score 3, completed 11/29/2008  Qualifier: Diagnosis of   By: Gladis FNP, Nykedtra         HEADACHE, TENSION 02/03/2008   Qualifier: Diagnosis of   By: Gladis FNP, Nykedtra       Hepatitis B 03/1963   Hypertension    LEG CRAMPS 02/03/2008   Qualifier: Diagnosis of   By: Gladis FNP, Nykedtra       PONV (postoperative nausea and vomiting)    Primary localized osteoarthritis of right knee 07/05/2018   Seasonal allergies     Patient Active Problem List   Diagnosis Date Noted   Encounter for annual health examination 04/15/2023   Pure hypercholesterolemia 04/15/2023   Seasonal allergies 04/15/2023   COVID-19 vaccination declined 02/02/2023   Herpes zoster vaccination declined 02/02/2023   Tetanus, diphtheria, and acellular pertussis (Tdap) vaccination declined 02/02/2023   BMI 29.0-29.9,adult 02/02/2023   Primary localized osteoarthritis of right knee 07/05/2018   Status post right partial knee replacement 07/05/2018   Arthropathy, lower leg 11/06/2009   Asymptomatic postmenopausal status 11/29/2008   MICROALBUMINURIA 07/27/2007   Essential hypertension 04/29/2007   POSTPROCEDURAL STATE, CATARACT EXTRACTION 04/29/2007    Past Surgical History:  Procedure Laterality Date   ABDOMINAL HYSTERECTOMY  01/1991   APPENDECTOMY     CATARACT EXTRACTION, BILATERAL     CHOLECYSTECTOMY  10/2017   PARTIAL KNEE ARTHROPLASTY Right 07/05/2018   Procedure: UNICOMPARTMENTAL KNEE;   Surgeon: Josefina Chew, MD;  Location: [facility];  Service: Orthopedics;  Laterality: Right;   REPLACEMENT TOTAL KNEE Left 2012    OB History   No obstetric history on file.      Home Medications    Prior to Admission medications   Medication Sig Start Date End Date Taking? Authorizing Provider  predniSONE  (DELTASONE ) 10 MG tablet Take 3 tablets (30 mg total) by mouth daily with breakfast for 4 days. 03/14/24 03/18/24 Yes Elgin Carn, [place], PA-C  amLODipine  (NORVASC ) 10 MG tablet TAKE 1 TABLET EVERY DAY 09/29/23   Georgina Speaks, FNP  cetirizine  (ZYRTEC  ALLERGY) 10 MG tablet Take 1 tablet (10 mg total) by mouth daily. 10/13/23 10/12/24  Moore, Janece, FNP  losartan  (COZAAR ) 100 MG tablet TAKE 1 TABLET EVERY DAY 09/29/23   Moore, Janece, FNP  lovastatin  (MEVACOR ) 40 MG tablet TAKE 1 TABLET EVERY DAY 09/29/23   Moore, Janece, FNP  potassium chloride  (MICRO-K ) 10 MEQ CR capsule TAKE 1 CAPSULE EVERY DAY 07/23/23   Georgina Speaks, FNP    Family History History reviewed. No pertinent family history.  Social History Social History   Tobacco Use   Smoking status: Never   Smokeless tobacco: Never  Vaping Use   Vaping status: Never Used  Substance Use Topics   Alcohol use: Not Currently   Drug use: Not Currently     Allergies   Contrast media [iodinated contrast media] and Latex   Review of Systems Review of Systems  As per HPI  Physical  Exam Triage Vital Signs ED Triage Vitals  Encounter Vitals Group     BP 03/13/24 1122 133/73     Girls Systolic BP Percentile --      Girls Diastolic BP Percentile --      Boys Systolic BP Percentile --      Boys Diastolic BP Percentile --      Pulse Rate 03/13/24 1122 80     Resp 03/13/24 1122 (!) 22     Temp 03/13/24 1122 98.6 F (37 C)     Temp Source 03/13/24 1122 Oral     SpO2 03/13/24 1122 94 %     Weight --      Height --      Head Circumference --      Peak Flow --      Pain Score 03/13/24 1120 10     Pain Loc --      Pain Education  --      Exclude from Growth Chart --    No data found.  Updated Vital Signs BP 133/73 (BP Location: Right Arm)   Pulse 80   Temp 98.6 F (37 C) (Oral)   Resp (!) 22   SpO2 94%    Physical Exam Vitals and nursing note reviewed.  Constitutional:      General: She is not in acute distress.    Comments: Patient is crying, rocking back and forth on the exam table holding left wrist to chest  HENT:     Mouth/Throat:     Pharynx: Oropharynx is clear.  Cardiovascular:     Rate and Rhythm: Normal rate and regular rhythm.     Pulses: Normal pulses.  Pulmonary:     Effort: Pulmonary effort is normal.  Musculoskeletal:     Left wrist: Tenderness present.     Cervical back: Normal range of motion.     Comments: Left hand tenderness at base of thumb. Hypertrophy of joint consistent with degenerative change.  Exam is limited due to patient discomfort.  She does have some range of motion.  Distal sensation intact. Cap refill fingers < 2 seconds. Radial pulse 2+  Skin:    Capillary Refill: Capillary refill takes less than 2 seconds.  Neurological:     Mental Status: She is alert.  Psychiatric:        Mood and Affect: Affect is tearful.        Behavior: Behavior is agitated.     UC Treatments / Results  Labs (all labs ordered are listed, but only abnormal results are displayed) Labs Reviewed - No data to display  EKG  Radiology DG Wrist Complete Left Result Date: 03/13/2024 CLINICAL DATA:  Left wrist pain at the base of the thumb for the past 3 days, significantly worse yesterday. No reported injury. EXAM: LEFT WRIST - COMPLETE 3+ VIEW COMPARISON:  None Available. FINDINGS: Marked 1st metacarpal/carpal degenerative changes involving the adjacent base of the 2nd metacarpal. Mild shortening of the distal ulna relative to the distal radius. Marked 1st IP joint degenerative changes. No fracture or dislocation seen. IMPRESSION: 1. Marked 1st metacarpal/carpal degenerative changes. 2.  Marked 1st IP joint degenerative changes. 3. Mild negative ulnar variance. Electronically Signed   By: Elspeth Bathe M.D.   On: 03/13/2024 11:49    Procedures Procedures  Medications Ordered in UC Medications  predniSONE  (DELTASONE ) tablet 20 mg (20 mg Oral Given 03/13/24 1222)  predniSONE  (DELTASONE ) tablet 10 mg (10 mg Oral Given 03/13/24 1223)    Initial  Impression / Assessment and Plan / UC Course  I have reviewed the triage vital signs and the nursing notes.  Pertinent labs & imaging results that were available during my care of the patient were reviewed by me and considered in my medical decision making (see chart for details).  Patient crying through visit, stating give me something for pain before I die.  Left wrist xray with degenerative changes 1st and 2nd metacarpal bases. Images independently reviewed by me, agree with radiology interpretation. Discussion with patient. I recommend using wrist brace.  She reports steroid helping in the past. 30 mg prednisone  dose in clinic. Will continue 30 mg daily for a total of 5 days. Discussed she needs to follow with her sports medicine specialist. Provided with [facility] which is who she has seen in the past, and had 2 knee replacements with them. Return and ED precautions  Final Clinical Impressions(s) / UC Diagnoses   Final diagnoses:  Left wrist pain  Osteoarthritis of left hand, unspecified osteoarthritis type     Discharge Instructions      You will need to call your orthopedic clinic to make an appointment for follow up. Call today.  The prednisone  tablets given today will be your first dose. Starting tomorrow morning, you will continue with your second dose. Continue daily for a total of 5 doses.  Use the wrist brace for support.      ED Prescriptions     Medication Sig Dispense Auth. Provider   predniSONE  (DELTASONE ) 10 MG tablet Take 3 tablets (30 mg total) by mouth daily with breakfast for 4  days. 12 tablet Everlean Bucher, [place], PA-C      I have reviewed the PDMP during this encounter.   Hersel Mcmeen, [place] RIGGERS 03/13/24 1249

## 2024-04-05 DIAGNOSIS — I10 Essential (primary) hypertension: Secondary | ICD-10-CM | POA: Diagnosis not present

## 2024-04-05 DIAGNOSIS — E782 Mixed hyperlipidemia: Secondary | ICD-10-CM | POA: Diagnosis not present

## 2024-04-05 DIAGNOSIS — M150 Primary generalized (osteo)arthritis: Secondary | ICD-10-CM | POA: Diagnosis not present

## 2024-04-19 ENCOUNTER — Other Ambulatory Visit: Payer: Self-pay | Admitting: Nurse Practitioner

## 2024-04-19 DIAGNOSIS — I10 Essential (primary) hypertension: Secondary | ICD-10-CM

## 2024-04-19 DIAGNOSIS — E7800 Pure hypercholesterolemia, unspecified: Secondary | ICD-10-CM

## 2024-04-24 ENCOUNTER — Encounter: Payer: Self-pay | Admitting: Nurse Practitioner

## 2024-04-24 ENCOUNTER — Ambulatory Visit: Payer: Self-pay | Admitting: Nurse Practitioner

## 2024-04-24 VITALS — BP 140/62 | HR 65 | Temp 98.9°F | Ht 60.0 in | Wt 150.0 lb

## 2024-04-24 DIAGNOSIS — Z79899 Other long term (current) drug therapy: Secondary | ICD-10-CM

## 2024-04-24 DIAGNOSIS — E7800 Pure hypercholesterolemia, unspecified: Secondary | ICD-10-CM

## 2024-04-24 DIAGNOSIS — I10 Essential (primary) hypertension: Secondary | ICD-10-CM | POA: Diagnosis not present

## 2024-04-24 DIAGNOSIS — Z2821 Immunization not carried out because of patient refusal: Secondary | ICD-10-CM

## 2024-04-24 DIAGNOSIS — Z0000 Encounter for general adult medical examination without abnormal findings: Secondary | ICD-10-CM

## 2024-04-24 LAB — POCT URINALYSIS DIP (CLINITEK)
Bilirubin, UA: NEGATIVE
Blood, UA: NEGATIVE
Glucose, UA: NEGATIVE mg/dL
Ketones, POC UA: NEGATIVE mg/dL
Leukocytes, UA: NEGATIVE
Nitrite, UA: NEGATIVE
POC PROTEIN,UA: 30 — AB
Spec Grav, UA: 1.015 (ref 1.010–1.025)
Urobilinogen, UA: 0.2 U/dL
pH, UA: 6 (ref 5.0–8.0)

## 2024-04-24 NOTE — Assessment & Plan Note (Addendum)
 Blood pressure is well slightly elevated, repeat is about the same. Encouraged to limit salt intake. Continue current medications. Continue f/u with Dr. Levern, will send copy of lab results. No significant change with previous EKG

## 2024-04-24 NOTE — Assessment & Plan Note (Signed)
 Discussed sleep patterns, foot swelling, dental health, and breast cancer screening. No further mammograms required unless desired. - Encourage 64 ounces of water  daily, avoid excess. - Advise regular dental visits. - Encourage monthly self-breast exams. - Offer mammogram if desired, declined.  - Proceed with lab work. - Send lab results to Dr. Levern.

## 2024-04-24 NOTE — Progress Notes (Signed)
 I,Jameka J Llittleton, CMA,acting as a Neurosurgeon for [employer], FNP.,have documented all relevant documentation on the behalf of Tara Ada, FNP,as directed by  Tara Ada, FNP while in the presence of Tara Ada, FNP.  Subjective:    Patient ID: Tara Collins , female    DOB: [birth year] , 89 y.o.   MRN: [id number]  Chief Complaint  Patient presents with   Annual Exam    Patient presents today for HM, patient reports compliance with medications. Patient denies any chest pain, SOB, or headaches. Patient has no concerns today.       HPI  Discussed the use of AI scribe software for clinical note transcription with the patient, who gave verbal consent to proceed.  History of Present Illness Tara Collins is an 89 year old female who presents for an annual physical exam.  She has been generally well since her last visit, except for issues with sleep. She sometimes naps during the day, which affects her ability to sleep at night.  She has been seeing her cardiologist every three to four months with no changes in her condition.  She engages in physical activity almost every day, except for weekends, and helps her daughter with her grandchildren. She also does household chores like cleaning and gardening.  Regarding her diet, she does not follow a specific diet but avoids certain foods she dislikes. She mentions that when she drinks a lot of water , her feet swell. She used to drink four to five liters of water  a day but has since reduced her intake to smaller bottles.  No constipation or diarrhea. She has not been doing self-breast exams and has not had a mammogram in over twenty years.  She has a fear of heights, which affects her ability to go to certain medical facilities for lab work. She also has difficulty using computers for registration at medical facilities.   Past Medical History:  Diagnosis Date   Arthritis    DEPRESSION/ANXIETY 11/29/2008   Annotation: PHQ-9 score 3,  completed 11/29/2008  Qualifier: Diagnosis of   By: Gladis FNP, Nykedtra         HEADACHE, TENSION 02/03/2008   Qualifier: Diagnosis of   By: Gladis FNP, Nykedtra       Hepatitis B 03/1963   Hypertension    LEG CRAMPS 02/03/2008   Qualifier: Diagnosis of   By: Gladis FNP, Nykedtra       PONV (postoperative nausea and vomiting)    Primary localized osteoarthritis of right knee 07/05/2018   Seasonal allergies      History reviewed. No pertinent family history.   Current Outpatient Medications:    amLODipine  (NORVASC ) 10 MG tablet, TAKE 1 TABLET (10 MG TOTAL) BY MOUTH DAILY., Disp: 90 tablet, Rfl: 3   cetirizine  (ZYRTEC  ALLERGY) 10 MG tablet, Take 1 tablet (10 mg total) by mouth daily., Disp: 90 tablet, Rfl: 1   losartan  (COZAAR ) 100 MG tablet, TAKE 1 TABLET (100 MG TOTAL) BY MOUTH DAILY., Disp: 90 tablet, Rfl: 3   lovastatin  (MEVACOR ) 40 MG tablet, TAKE 1 TABLET (40 MG TOTAL) BY MOUTH DAILY., Disp: 90 tablet, Rfl: 3   potassium chloride  (MICRO-K ) 10 MEQ CR capsule, TAKE 1 CAPSULE EVERY DAY, Disp: 90 capsule, Rfl: 3   Allergies  Allergen Reactions   Contrast Media [Iodinated Contrast Media] Shortness Of Breath    ALLERGY ADDED FROM QUESTIONING PT  SHE HAD A PREVIOUS REACTION TO CONTRAST    Latex Rash    skin burns  The patient states she uses status post hysterectomy for birth control. No LMP recorded. Patient has had a hysterectomy.. Negative for Dysmenorrhea and Negative for Menorrhagia. Negative for: breast discharge, breast lump(s), breast pain and breast self exam. Associated symptoms include abnormal vaginal bleeding. Pertinent negatives include abnormal bleeding (hematology), anxiety, decreased libido, depression, difficulty falling sleep, dyspareunia, history of infertility, nocturia, sexual dysfunction, sleep disturbances, urinary incontinence, urinary urgency, vaginal discharge and vaginal itching. Diet regular.  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 She will exercise 5 days a week.   The patient's tobacco use is:  Social History   Tobacco Use  Smoking Status Never  Smokeless Tobacco Never  She has been exposed to passive smoke. The patient's alcohol use is:  Social History   Substance and Sexual Activity  Alcohol Use Not Currently    Review of Systems  Constitutional: Negative.   HENT: Negative.    Eyes: Negative.   Respiratory: Negative.    Cardiovascular: Negative.   Gastrointestinal: Negative.   Endocrine: Negative.   Genitourinary: Negative.   Musculoskeletal: Negative.   Skin: Negative.   Allergic/Immunologic: Negative.   Neurological: Negative.   Hematological: Negative.   Psychiatric/Behavioral: Negative.       Today's Vitals   04/24/24 0829 04/24/24 0934  BP: (!) 140/60 (!) 140/62  Pulse: 65   Temp: 98.9 F (37.2 C)   Weight: 150 lb (68 kg)   Height: 5' (1.524 m)   PainSc: 0-No pain    Body mass index is 29.29 kg/m.  Wt Readings from Last 3 Encounters:  04/24/24 150 lb (68 kg)  10/13/23 152 lb 9.6 oz (69.2 kg)  05/05/23 149 lb 3.2 oz (67.7 kg)     Objective:  Physical Exam Vitals and nursing note reviewed.  Constitutional:      General: She is not in acute distress.    Appearance: Normal appearance. She is well-developed.  HENT:     Head: Normocephalic and atraumatic.     Right Ear: Hearing, tympanic membrane, ear canal and external ear normal. There is no impacted cerumen.     Left Ear: Hearing, tympanic membrane, ear canal and external ear normal. There is no impacted cerumen.     Nose: Nose normal.     Mouth/Throat:     Mouth: Mucous membranes are moist.  Eyes:     General: Lids are normal.     Extraocular Movements: Extraocular movements intact.     Conjunctiva/sclera: Conjunctivae normal.     Pupils: Pupils are equal, round, and reactive to light.     Funduscopic exam:    Right eye: No papilledema.        Left eye: No papilledema.  Neck:      Thyroid : No thyroid  mass.     Vascular: No carotid bruit.  Cardiovascular:     Rate and Rhythm: Normal rate and regular rhythm.     Pulses: Normal pulses.     Heart sounds: Normal heart sounds. No murmur heard. Pulmonary:     Effort: Pulmonary effort is normal. No respiratory distress.     Breath sounds: Normal breath sounds. No wheezing.  Chest:     Chest wall: No mass.  Breasts:    Tanner Score is 5.     Right: Normal. No mass or tenderness.     Left: Normal. No mass or tenderness.  Abdominal:     General: Abdomen is flat. Bowel sounds are  normal. There is no distension.     Palpations: Abdomen is soft.     Tenderness: There is no abdominal tenderness.  Musculoskeletal:        General: No swelling or tenderness. Normal range of motion.     Cervical back: Full passive range of motion without pain, normal range of motion and neck supple.     Right knee: Crepitus present.     Left knee: Crepitus present.     Right lower leg: No edema.     Left lower leg: No edema.  Lymphadenopathy:     Upper Body:     Right upper body: No supraclavicular, axillary or pectoral adenopathy.     Left upper body: No supraclavicular, axillary or pectoral adenopathy.  Skin:    General: Skin is warm and dry.     Capillary Refill: Capillary refill takes less than 2 seconds.     Comments: Surgical scar noted to bilateral knees  Neurological:     General: No focal deficit present.     Mental Status: She is alert and oriented to person, place, and time.     Cranial Nerves: No cranial nerve deficit.     Sensory: No sensory deficit.     Motor: No weakness.  Psychiatric:        Mood and Affect: Mood normal.        Behavior: Behavior normal.        Thought Content: Thought content normal.        Judgment: Judgment normal.     Assessment And Plan:     Encounter for annual health examination Assessment & Plan: Discussed sleep patterns, foot swelling, dental health, and breast cancer screening. No  further mammograms required unless desired. - Encourage 64 ounces of water  daily, avoid excess. - Advise regular dental visits. - Encourage monthly self-breast exams. - Offer mammogram if desired, declined.  - Proceed with lab work. - Send lab results to Dr. Levern.   Essential hypertension Assessment & Plan: Blood pressure is well slightly elevated, repeat is about the same. Encouraged to limit salt intake. Continue current medications. Continue f/u with Dr. Levern, will send copy of lab results  Orders: -     POCT URINALYSIS DIP (CLINITEK) -     Microalbumin / creatinine urine ratio -     EKG 12-Lead -     CMP14+EGFR  Pure hypercholesterolemia Assessment & Plan: Cholesterol levels are well controlled, continue current medications and focusing on low fat diet  Orders: -     CMP14+EGFR -     Lipid panel  Other long term (current) drug therapy -     CBC  Pneumococcal vaccination declined  Influenza vaccination declined  Herpes zoster vaccination declined     Return for 1 year physical. Patient was given opportunity to ask questions. Patient verbalized understanding of the plan and was able to repeat key elements of the plan. All questions were answered to their satisfaction.   Tara Ada, FNP  I, Tara Ada, FNP, have reviewed all documentation for this visit. The documentation on 04/24/24 for the exam, diagnosis, procedures, and orders are all accurate and complete.

## 2024-04-24 NOTE — Assessment & Plan Note (Signed)
Cholesterol levels are well controlled, continue current medications and focusing on low fat diet

## 2024-04-24 NOTE — Patient Instructions (Signed)

## 2024-04-25 LAB — CMP14+EGFR
ALT: 19 IU/L (ref 0–32)
AST: 26 IU/L (ref 0–40)
Albumin: 4.4 g/dL (ref 3.7–4.7)
Alkaline Phosphatase: 99 IU/L (ref 48–129)
BUN/Creatinine Ratio: 17 (ref 12–28)
BUN: 14 mg/dL (ref 8–27)
Bilirubin Total: 0.7 mg/dL (ref 0.0–1.2)
CO2: 23 mmol/L (ref 20–29)
Calcium: 9.9 mg/dL (ref 8.7–10.3)
Chloride: 105 mmol/L (ref 96–106)
Creatinine, Ser: 0.84 mg/dL (ref 0.57–1.00)
Globulin, Total: 2.8 g/dL (ref 1.5–4.5)
Glucose: 68 mg/dL — ABNORMAL LOW (ref 70–99)
Potassium: 3.4 mmol/L — ABNORMAL LOW (ref 3.5–5.2)
Sodium: 144 mmol/L (ref 134–144)
Total Protein: 7.2 g/dL (ref 6.0–8.5)
eGFR: 67 mL/min/1.73 (ref >59)

## 2024-04-25 LAB — CBC
Hematocrit: 43.5 % (ref 34.0–46.6)
Hemoglobin: 14.4 g/dL (ref 11.1–15.9)
MCH: 34 pg — ABNORMAL HIGH (ref 26.6–33.0)
MCHC: 33.1 g/dL (ref 31.5–35.7)
MCV: 103 fL — ABNORMAL HIGH (ref 79–97)
Platelets: 267 x10E3/uL (ref 150–450)
RBC: 4.23 x10E6/uL (ref 3.77–5.28)
RDW: 12.4 % (ref 11.7–15.4)
WBC: 5.6 x10E3/uL (ref 3.4–10.8)

## 2024-04-25 LAB — LIPID PANEL
Chol/HDL Ratio: 2.7 ratio (ref 0.0–4.4)
Cholesterol, Total: 141 mg/dL (ref 100–199)
HDL: 52 mg/dL (ref >39)
LDL Chol Calc (NIH): 69 mg/dL (ref 0–99)
Triglycerides: 108 mg/dL (ref 0–149)
VLDL Cholesterol Cal: 20 mg/dL (ref 5–40)

## 2024-04-25 LAB — MICROALBUMIN / CREATININE URINE RATIO
Creatinine, Urine: 156.4 mg/dL
Microalb/Creat Ratio: 64 mg/g{creat} — ABNORMAL HIGH (ref 0–29)
Microalbumin, Urine: 100.8 ug/mL

## 2024-05-03 ENCOUNTER — Ambulatory Visit: Payer: Self-pay | Admitting: Nurse Practitioner

## 2024-05-06 ENCOUNTER — Other Ambulatory Visit: Payer: Self-pay | Admitting: Nurse Practitioner

## 2024-05-06 DIAGNOSIS — I10 Essential (primary) hypertension: Secondary | ICD-10-CM

## 2024-05-10 ENCOUNTER — Ambulatory Visit

## 2024-05-10 DIAGNOSIS — Z0000 Encounter for general adult medical examination without abnormal findings: Secondary | ICD-10-CM | POA: Diagnosis not present

## 2024-05-10 NOTE — Progress Notes (Signed)
 Subjective:   Tara Collins is a 89 y.o. who presents for a Medicare Wellness preventive visit.  As a reminder, Annual Wellness Visits don't include a physical exam, and some assessments may be limited, especially if this visit is performed virtually. We may recommend an in-person follow-up visit with your provider if needed.  Visit Complete: Virtual I connected with  Tara Collins on 05/10/24 by a audio enabled telemedicine application and verified that I am speaking with the correct person using two identifiers.  Patient Location: Home  Provider Location: Office/Clinic  I discussed the limitations of evaluation and management by telemedicine. The patient expressed understanding and agreed to proceed.  Vital Signs: Because this visit was a virtual/telehealth visit, some criteria may be missing or patient reported. Any vitals not documented were not able to be obtained and vitals that have been documented are patient reported.  VideoError- Librarian, academic were attempted between this provider and patient, however failed, due to patient having technical difficulties OR patient did not have access to video capability.  We continued and completed visit with audio only.   Persons Participating in Visit: Patient.  AWV Questionnaire: No: Patient Medicare AWV questionnaire was not completed prior to this visit.  Cardiac Risk Factors include: advanced age (>22men, >28 women);dyslipidemia;hypertension     Objective:    Today's Vitals   05/10/24 0816  PainSc: 3    There is no height or weight on file to calculate BMI.     05/10/2024    8:21 AM 05/05/2023    8:31 AM 04/16/2022    8:26 AM 03/27/2022    6:50 PM 04/03/2021   11:03 AM 03/21/2020   10:34 AM 03/16/2019   10:10 AM  Advanced Directives  Does Patient Have a Medical Advance Directive? No No No No No No No  Would patient like information on creating a medical advance directive? No - Patient declined  No - Patient declined No - Patient declined No - Patient declined Yes (MAU/Ambulatory/Procedural Areas - Information given)      Current Medications (verified) Outpatient Encounter Medications as of 05/10/2024  Medication Sig   amLODipine  (NORVASC ) 10 MG tablet TAKE 1 TABLET (10 MG TOTAL) BY MOUTH DAILY.   cetirizine  (ZYRTEC  ALLERGY) 10 MG tablet Take 1 tablet (10 mg total) by mouth daily.   losartan  (COZAAR ) 100 MG tablet TAKE 1 TABLET (100 MG TOTAL) BY MOUTH DAILY.   lovastatin  (MEVACOR ) 40 MG tablet TAKE 1 TABLET (40 MG TOTAL) BY MOUTH DAILY.   potassium chloride  (MICRO-K ) 10 MEQ CR capsule TAKE 1 CAPSULE EVERY DAY   No facility-administered encounter medications on file as of 05/10/2024.    Allergies (verified) Contrast media [iodinated contrast media] and Latex   History: Past Medical History:  Diagnosis Date   Arthritis    DEPRESSION/ANXIETY 11/29/2008   Annotation: PHQ-9 score 3, completed 11/29/2008  Qualifier: Diagnosis of   By: Gladis FNP, Nykedtra         HEADACHE, TENSION 02/03/2008   Qualifier: Diagnosis of   By: Gladis FNP, Nykedtra       Hepatitis B 03/1963   Hypertension    LEG CRAMPS 02/03/2008   Qualifier: Diagnosis of   By: Gladis FNP, Nykedtra       PONV (postoperative nausea and vomiting)    Primary localized osteoarthritis of right knee 07/05/2018   Seasonal allergies    Past Surgical History:  Procedure Laterality Date   ABDOMINAL HYSTERECTOMY  01/1991   APPENDECTOMY  CATARACT EXTRACTION, BILATERAL     CHOLECYSTECTOMY  10/2017   PARTIAL KNEE ARTHROPLASTY Right 07/05/2018   Procedure: UNICOMPARTMENTAL KNEE;  Surgeon: Josefina Chew, MD;  Location: [facility];  Service: Orthopedics;  Laterality: Right;   REPLACEMENT TOTAL KNEE Left 2012   History reviewed. No pertinent family history. Social History   Socioeconomic History   Marital status: Single    Spouse name: Not on file   Number of children: Not on file   Years of education: Not on file    Highest education level: Not on file  Occupational History   Occupation: retired  Tobacco Use   Smoking status: Never   Smokeless tobacco: Never  Vaping Use   Vaping status: Never Used  Substance and Sexual Activity   Alcohol use: Not Currently   Drug use: Not Currently   Sexual activity: Not Currently  Other Topics Concern   Not on file  Social History Narrative   Not on file   Social Drivers of Health   Financial Resource Strain: Low Risk  (05/10/2024)   Overall Financial Resource Strain (CARDIA)    Difficulty of Paying Living Expenses: Not hard at all  Food Insecurity: No Food Insecurity (05/10/2024)   Hunger Vital Sign    Worried About Running Out of Food in the Last Year: Never true    Ran Out of Food in the Last Year: Never true  Transportation Needs: No Transportation Needs (05/10/2024)   PRAPARE - Administrator, Civil Service (Medical): No    Lack of Transportation (Non-Medical): No  Physical Activity: Inactive (05/10/2024)   Exercise Vital Sign    Days of Exercise per Week: 0 days    Minutes of Exercise per Session: 0 min  Stress: No Stress Concern Present (05/10/2024)   [employer] of Occupational Health - Occupational Stress Questionnaire    Feeling of Stress: Not at all  Social Connections: Moderately Isolated (05/10/2024)   Social Connection and Isolation Panel    Frequency of Communication with Friends and Family: More than three times a week    Frequency of Social Gatherings with Friends and Family: More than three times a week    Attends Religious Services: More than 4 times per year    Active Member of [employer] or Organizations: No    Attends Engineer, structural: Never    Marital Status: Never married    Tobacco Counseling Counseling given: Not Answered    Clinical Intake:  Pre-visit preparation completed: Yes  Pain : 0-10 Pain Score: 3  Pain Type: Chronic pain Pain Location: Shoulder Pain Orientation: Left,  Right Pain Descriptors / Indicators: Aching Pain Onset: More than a month ago Pain Frequency: Intermittent     Nutritional Risks: None Diabetes: No  Lab Results  Component Value Date   HGBA1C 5.4 03/27/2021     How often do you need to have someone help you when you read instructions, pamphlets, or other written materials from your doctor or pharmacy?: 1 - Never  Interpreter Needed?: No  Information entered by :: NAllen LPN   Activities of Daily Living     05/10/2024    8:18 AM  In your present state of health, do you have any difficulty performing the following activities:  Hearing? 0  Vision? 0  Difficulty concentrating or making decisions? 1  Comment sometimes memory  Walking or climbing stairs? 1  Dressing or bathing? 0  Doing errands, shopping? 0  Preparing Food and eating ? N  Using  the Toilet? N  In the past six months, have you accidently leaked urine? N  Do you have problems with loss of bowel control? N  Managing your Medications? N  Managing your Finances? N  Housekeeping or managing your Housekeeping? N    Patient Care Team: Georgina Speaks, FNP as PCP - General (General Practice)  I have updated your Care Teams any recent Medical Services you may have received from other providers in the past year.     Assessment:   This is a routine wellness examination for Tara Collins.  Hearing/Vision screen Hearing Screening - Comments:: Denies hearing issues Vision Screening - Comments:: Regular eye exams, [facility]   Goals Addressed             This Visit's Progress    Patient Stated       05/10/2024, wants to keep kidney working       Depression Screen     05/10/2024    8:23 AM 04/24/2024    8:33 AM 10/13/2023    8:26 AM 05/05/2023    8:33 AM 04/15/2023    8:37 AM 02/02/2023    8:39 AM 09/29/2022   11:07 AM  PHQ 2/9 Scores  PHQ - 2 Score 0 0 0 0 0 0 0  PHQ- 9 Score 0 0 0 0 0 0     Fall Risk     05/10/2024    8:22 AM 04/24/2024    8:33 AM 10/13/2023     8:26 AM 05/05/2023    8:32 AM 04/15/2023    8:37 AM  Fall Risk   Falls in the past year? 0 0 0 0   Number falls in past yr: 0 0 0 0 0  Injury with Fall? 0 0 0 0 0  Risk for fall due to : Medication side effect No Fall Risks No Fall Risks Medication side effect;Impaired mobility;Impaired balance/gait No Fall Risks  Follow up Falls evaluation completed;Falls prevention discussed Falls evaluation completed Falls evaluation completed Falls prevention discussed;Falls evaluation completed Falls evaluation completed    MEDICARE RISK AT HOME:  Medicare Risk at Home Any stairs in or around the home?: No If so, are there any without handrails?: No Home free of loose throw rugs in walkways, pet beds, electrical cords, etc?: Yes Adequate lighting in your home to reduce risk of falls?: Yes Life alert?: No Use of a cane, walker or w/c?: Yes Grab bars in the bathroom?: No Shower chair or bench in shower?: No Elevated toilet seat or a handicapped toilet?: Yes  TIMED UP AND GO:  Was the test performed?  No  Cognitive Function: 6CIT completed        05/10/2024    8:23 AM 05/05/2023    8:36 AM 04/16/2022    8:28 AM 04/03/2021   11:08 AM 03/21/2020   10:38 AM  6CIT Screen  What Year? 0 points 0 points 0 points 0 points 0 points  What month? 0 points 0 points 0 points 0 points 0 points  What time? 0 points 0 points 3 points 0 points 0 points  Count back from 20 0 points 0 points 0 points 0 points 0 points  Months in reverse 0 points 0 points 0 points 0 points 2 points  Repeat phrase 4 points 6 points 4 points 10 points 6 points  Total Score 4 points 6 points 7 points 10 points 8 points    Immunizations Immunization History  Administered Date(s) Administered   Influenza Whole 07/27/2006  PFIZER(Purple Top)SARS-COV-2 Vaccination 09/09/2019, 10/04/2019, 06/15/2020   Pneumococcal Polysaccharide-23 11/29/2008    Screening Tests Health Maintenance  Topic Date Due   COVID-19 Vaccine (4 -  2025-26 season) 03/27/2024   Zoster Vaccines- Shingrix (1 of 2) 07/24/2024 (Originally 01/12/1954)   Influenza Vaccine  10/24/2024 (Originally 02/25/2024)   Pneumococcal Vaccine: 50+ Years (2 of 2 - PCV) 04/24/2025 (Originally 11/29/2009)   Medicare Annual Wellness (AWV)  05/10/2025   DEXA SCAN  Completed   Meningococcal B Vaccine  Aged Out   DTaP/Tdap/Td  Discontinued   Mammogram  Discontinued    Health Maintenance Items Addressed: Declines vaccines  Additional Screening:  Vision Screening: Recommended annual ophthalmology exams for early detection of glaucoma and other disorders of the eye. Is the patient up to date with their annual eye exam?  No  Who is the provider or what is the name of the office in which the patient attends annual eye exams? [facility]  Dental Screening: Recommended annual dental exams for proper oral hygiene  Community Resource Referral / Chronic Care Management: CRR required this visit?  No   CCM required this visit?  No   Plan:    I have personally reviewed and noted the following in the patient's chart:   Medical and social history Use of alcohol, tobacco or illicit drugs  Current medications and supplements including opioid prescriptions. Patient is not currently taking opioid prescriptions. Functional ability and status Nutritional status Physical activity Advanced directives List of other physicians Hospitalizations, surgeries, and ER visits in previous 12 months Vitals Screenings to include cognitive, depression, and falls Referrals and appointments  In addition, I have reviewed and discussed with patient certain preventive protocols, quality metrics, and best practice recommendations. A written personalized care plan for preventive services as well as general preventive health recommendations were provided to patient.   Tara FORBES Dawn, LPN   [id number]   After Visit Summary: (Pick Up) Due to this being a telephonic visit, with patients  personalized plan was offered to patient and patient has requested to Pick up at office.  Notes: Nothing significant to report at this time.

## 2024-05-10 NOTE — Patient Instructions (Signed)
 Tara Collins,  Thank you for taking the time for your Medicare Wellness Visit. I appreciate your continued commitment to your health goals. Please review the care plan we discussed, and feel free to reach out if I can assist you further.  Medicare recommends these wellness visits once per year to help you and your care team stay ahead of potential health issues. These visits are designed to focus on prevention, allowing your provider to concentrate on managing your acute and chronic conditions during your regular appointments.  Please note that Annual Wellness Visits do not include a physical exam. Some assessments may be limited, especially if the visit was conducted virtually. If needed, we may recommend a separate in-person follow-up with your provider.  Ongoing Care Seeing your primary care provider every 3 to 6 months helps us  monitor your health and provide consistent, personalized care.   Referrals If a referral was made during today's visit and you haven't received any updates within two weeks, please contact the referred provider directly to check on the status.  Recommended Screenings:  Health Maintenance  Topic Date Due   COVID-19 Vaccine (4 - 2025-26 season) 03/27/2024   Zoster (Shingles) Vaccine (1 of 2) 07/24/2024*   Flu Shot  10/24/2024*   Pneumococcal Vaccine for age over 20 (2 of 2 - PCV) 04/24/2025*   Medicare Annual Wellness Visit  05/10/2025   DEXA scan (bone density measurement)  Completed   Meningitis B Vaccine  Aged Out   DTaP/Tdap/Td vaccine  Discontinued   Breast Cancer Screening  Discontinued  *Topic was postponed. The date shown is not the original due date.       05/10/2024    8:21 AM  Advanced Directives  Does Patient Have a Medical Advance Directive? No  Would patient like information on creating a medical advance directive? No - Patient declined   Advance Care Planning is important because it: Ensures you receive medical care that aligns with your  values, goals, and preferences. Provides guidance to your family and loved ones, reducing the emotional burden of decision-making during critical moments.  Vision: Annual vision screenings are recommended for early detection of glaucoma, cataracts, and diabetic retinopathy. These exams can also reveal signs of chronic conditions such as diabetes and high blood pressure.  Dental: Annual dental screenings help detect early signs of oral cancer, gum disease, and other conditions linked to overall health, including heart disease and diabetes.  Please see the attached documents for additional preventive care recommendations.

## 2025-04-26 ENCOUNTER — Encounter: Payer: Self-pay | Admitting: Nurse Practitioner

## 2025-06-13 ENCOUNTER — Ambulatory Visit: Payer: Self-pay
